# Patient Record
Sex: Male | Born: 1935 | Hispanic: No | Marital: Single | Smoking: Never smoker
Health system: Southern US, Community
[De-identification: ages and names within clinical notes are randomized; demographics above are authoritative.]

## PROBLEM LIST (undated history)

## (undated) DIAGNOSIS — R531 Weakness: Secondary | ICD-10-CM

## (undated) DIAGNOSIS — R0600 Dyspnea, unspecified: Secondary | ICD-10-CM

## (undated) DIAGNOSIS — R011 Cardiac murmur, unspecified: Secondary | ICD-10-CM

## (undated) DIAGNOSIS — Q248 Other specified congenital malformations of heart: Secondary | ICD-10-CM

## (undated) DIAGNOSIS — I10 Essential (primary) hypertension: Secondary | ICD-10-CM

## (undated) DIAGNOSIS — E871 Hypo-osmolality and hyponatremia: Secondary | ICD-10-CM

## (undated) DIAGNOSIS — E039 Hypothyroidism, unspecified: Secondary | ICD-10-CM

## (undated) DIAGNOSIS — I4510 Unspecified right bundle-branch block: Secondary | ICD-10-CM

## (undated) DIAGNOSIS — Z9289 Personal history of other medical treatment: Secondary | ICD-10-CM

## (undated) HISTORY — DX: Cardiac murmur, unspecified: R01.1

## (undated) HISTORY — DX: Hypo-osmolality and hyponatremia: E87.1

## (undated) HISTORY — DX: Other specified congenital malformations of heart: Q24.8

## (undated) HISTORY — DX: Weakness: R53.1

## (undated) HISTORY — DX: Essential (primary) hypertension: I10

## (undated) HISTORY — DX: Dyspnea, unspecified: R06.00

## (undated) HISTORY — DX: Hypothyroidism, unspecified: E03.9

## (undated) HISTORY — DX: Unspecified right bundle-branch block: I45.10

## (undated) HISTORY — DX: Personal history of other medical treatment: Z92.89

---

## 2010-04-22 HISTORY — PX: TRANSTHORACIC ECHOCARDIOGRAM: SHX275

## 2010-04-24 ENCOUNTER — Ambulatory Visit (HOSPITAL_COMMUNITY): Admission: RE | Admit: 2010-04-24 | Payer: Self-pay | Source: Home / Self Care | Admitting: Nephrology

## 2010-04-26 ENCOUNTER — Ambulatory Visit (HOSPITAL_COMMUNITY)
Admission: RE | Admit: 2010-04-26 | Discharge: 2010-04-26 | Payer: Self-pay | Source: Home / Self Care | Attending: Nephrology | Admitting: Nephrology

## 2010-04-26 ENCOUNTER — Ambulatory Visit: Admission: RE | Admit: 2010-04-26 | Discharge: 2010-04-26 | Payer: Self-pay | Source: Home / Self Care

## 2010-04-26 ENCOUNTER — Other Ambulatory Visit: Payer: Self-pay | Admitting: Nephrology

## 2010-04-26 ENCOUNTER — Encounter: Payer: Self-pay | Admitting: Cardiovascular Disease

## 2010-04-27 DIAGNOSIS — Z9289 Personal history of other medical treatment: Secondary | ICD-10-CM

## 2010-04-27 HISTORY — DX: Personal history of other medical treatment: Z92.89

## 2010-05-03 ENCOUNTER — Ambulatory Visit: Payer: Self-pay | Admitting: Cardiology

## 2010-05-22 ENCOUNTER — Ambulatory Visit: Payer: Self-pay | Admitting: Cardiology

## 2010-06-13 ENCOUNTER — Ambulatory Visit (INDEPENDENT_AMBULATORY_CARE_PROVIDER_SITE_OTHER): Payer: Self-pay | Admitting: Cardiology

## 2010-06-13 DIAGNOSIS — I421 Obstructive hypertrophic cardiomyopathy: Secondary | ICD-10-CM

## 2010-09-13 ENCOUNTER — Encounter: Payer: Self-pay | Admitting: *Deleted

## 2010-09-13 ENCOUNTER — Ambulatory Visit: Payer: Self-pay | Admitting: *Deleted

## 2010-09-14 ENCOUNTER — Ambulatory Visit (INDEPENDENT_AMBULATORY_CARE_PROVIDER_SITE_OTHER): Payer: Medicare Other | Admitting: Cardiology

## 2010-09-14 ENCOUNTER — Encounter: Payer: Self-pay | Admitting: Cardiology

## 2010-09-14 DIAGNOSIS — I421 Obstructive hypertrophic cardiomyopathy: Secondary | ICD-10-CM | POA: Insufficient documentation

## 2010-09-14 DIAGNOSIS — I119 Hypertensive heart disease without heart failure: Secondary | ICD-10-CM

## 2010-09-14 DIAGNOSIS — I4510 Unspecified right bundle-branch block: Secondary | ICD-10-CM

## 2010-09-14 DIAGNOSIS — E039 Hypothyroidism, unspecified: Secondary | ICD-10-CM | POA: Insufficient documentation

## 2010-09-14 DIAGNOSIS — I428 Other cardiomyopathies: Secondary | ICD-10-CM

## 2010-09-14 NOTE — Progress Notes (Signed)
Casey Andrade Date of Birth:  Mar 27, 1920 [facility] / [facility] [address]   [address] [place], [place]  [address] [phone]           Fax   [phone]  History of Present Illness: This pleasant 77 year old gentleman is seen for a scheduled followup office visit.  He has a history of hypertrophic obstructive cardiomyopathy.  He has had a good response to beta blockade.  His last echocardiogram was on 04/26/10 and showed a systolic peak gradient of 73 the left ventricular outflow tract.  He had mild LVH and vigorous systolic function in the range of 65-70% and he had evidence of systolic anterior motion of the mitral chordae with moderate to severe mitral regurgitation.  Since starting on the beta blocker his heart murmur has decreased in intensity.  He has not been expressing any chest pain or shortness of breath.He presently is taking 37.5 mg Toprol each morning.  Current Outpatient Prescriptions  Medication Sig Dispense Refill  . Biotin (BIOTIN 5000) 5 MG CAPS Take 1 capsule by mouth daily.        . Cholecalciferol (VITAMIN D3) 2000 UNITS capsule Take 2,000 Units by mouth. occasionally       . diclofenac sodium (VOLTAREN) 1 % GEL Apply topically.        Marland Kitchen doxycycline (DORYX) 100 MG DR capsule Take 100 mg by mouth 2 (two) times daily. Taking as needed      . hydrocortisone 1 % lotion Apply 1 application topically daily. As directed       . levothyroxine (SYNTHROID, LEVOTHROID) 125 MCG tablet Take 125 mcg by mouth daily.        Marland Kitchen losartan (COZAAR) 100 MG tablet Take 100 mg by mouth daily.        . metoprolol succinate (TOPROL-XL) 25 MG 24 hr tablet Take 25 mg by mouth daily. Taking 1 and 1/2 daily      . vitamin B-12 (CYANOCOBALAMIN) 250 MCG tablet Take 250 mcg by mouth daily.        Marland Kitchen DISCONTD: tacrolimus (PROTOPIC) 0.1 % ointment Apply topically daily. As directed (cream)         No Known Allergies  Patient Active Problem List  Diagnoses  . HOCM (hypertrophic  obstructive cardiomyopathy)  . RBBB (right bundle branch block)  . Hypothyroidism  . Benign hypertensive heart disease without heart failure    History  Smoking status  . Never Smoker   Smokeless tobacco  . Not on file    History  Alcohol Use No    Family History  Problem Relation Age of Onset  . Diabetes Sister     Review of Systems: Constitutional: no fever chills diaphoresis or fatigue or change in weight.  Head and neck: no hearing loss, no epistaxis, no photophobia or visual disturbance. Respiratory: No cough, shortness of breath or wheezing. Cardiovascular: No chest pain peripheral edema, palpitations. Gastrointestinal: No abdominal distention, no abdominal pain, no change in bowel habits hematochezia or melena. Genitourinary: No dysuria, no frequency, no urgency, no nocturia. Musculoskeletal:No arthralgias, no back pain, no gait disturbance or myalgias.He does have a lot of problem with mobility of the shoulders. Neurological: No dizziness, no headaches, no numbness, no seizures, no syncope, no weakness, no tremors. Hematologic: No lymphadenopathy, no easy bruising. Psychiatric: No confusion, no hallucinations, no sleep disturbance.    Physical Exam: Filed Vitals:   09/14/10 1106  BP: 120/60  Pulse: 48  The general appearance reveals a frail elderly  gentleman in no acute distress.  He complains of being cold.Pupils equal and reactive.   Extraocular Movements are full.  There is no scleral icterus.  The mouth and pharynx are normal.  The neck is supple.  The carotids reveal no bruits.  The jugular venous pressure is normal.  The thyroid is not enlarged.  There is no lymphadenopathy.The chest is clear to percussion and auscultation. There are no rales or rhonchi. Expansion of the chest is symmetrical.The precordium is quiet.  The first heart sound is normal.  The second heart sound is physiologically split.  There is no gallop rub or click.There is a grade 2/6 systolic  ejection murmur at the apex and base.  There is no abnormal lift or heave.The abdomen is soft and nontender. Bowel sounds are normal. The liver and spleen are not enlarged. There Are no abdominal masses. There are no bruits.Normal extremity without phlebitis or edema.  His EKG shows sinus bradycardia at 48 and he has a pattern of a right bundle branch block.  He does not have any significant changes of left ventricular hypertrophy or strain.   Assessment / Plan:  Because of his resting bradycardia we will not increase his beta blocker any further at this point.  He feels well and is not having any dizziness or syncope or other cardiac side effects.  Recheck in 6 months for followup office visit and EKG

## 2010-09-14 NOTE — Assessment & Plan Note (Signed)
The patient has a history of hypertrophic obstructive cardiomyopathy.  This was picked up on the basis of a murmur which led to an echocardiogram done on 12/26/10.  He has dynamic obstruction through the left ventricular outflow tract by echo 70 mmHg.  His ejection fraction was in the range of 65-70% and he had evidence of grade 1 diastolic dysfunction.  His mitral valve exhibited systolic anterior motion of the anterior mitral chordae and there was a moderate to severe mitral regurgitation.  At that point the patient was placed on beta blocker.  Because of his resting relatively slow heart rate we have had to go up slowly on his beta blocker.  He has tolerated the beta blocker and has not been experiencing any chest pain or dyspnea.  He's not had Any dizzy spells or syncope.

## 2011-03-27 ENCOUNTER — Ambulatory Visit (INDEPENDENT_AMBULATORY_CARE_PROVIDER_SITE_OTHER): Payer: Medicare Other | Admitting: Cardiology

## 2011-03-27 ENCOUNTER — Encounter: Payer: Self-pay | Admitting: Cardiology

## 2011-03-27 VITALS — BP 126/78 | HR 54 | Ht 64.0 in | Wt 142.0 lb

## 2011-03-27 DIAGNOSIS — I119 Hypertensive heart disease without heart failure: Secondary | ICD-10-CM

## 2011-03-27 DIAGNOSIS — I421 Obstructive hypertrophic cardiomyopathy: Secondary | ICD-10-CM

## 2011-03-27 DIAGNOSIS — I4510 Unspecified right bundle-branch block: Secondary | ICD-10-CM

## 2011-03-27 NOTE — Patient Instructions (Signed)
Your physician recommends that you continue on your current medications as directed. Please refer to the Current Medication list given to you today.  Your physician wants you to follow-up in: 6 months. You will receive a reminder letter in the mail two months in advance. If you don't receive a letter, please call our office to schedule the follow-up appointment.  

## 2011-03-27 NOTE — Assessment & Plan Note (Signed)
The patient is tolerating the beta blocker well.  He denies any chest pain or shortness of breath or palpitations.  No dizziness or syncope.

## 2011-03-27 NOTE — Assessment & Plan Note (Signed)
EKG today shows no change in his right bundle branch block pattern.  He is having no symptoms referable to his bundle branch block.

## 2011-03-27 NOTE — Assessment & Plan Note (Signed)
Blood pressure is remaining in normal range on his current therapy of beta blocker and ARB

## 2011-03-27 NOTE — Progress Notes (Signed)
Casey Andrade Date of Birth:  11/07/1919 [facility] / [facility] [address]   [address] [place], [place]  [address] [phone]           Fax   [phone]  HPI: This pleasant 77 year old gentleman is seen for a six-month followup office visit.  He has a history of hypertrophic obstructive cardiomyopathy.  He has had a good response to beta blockade.  His last echocardiogram in January 2012 showed a peak systolic gradient of 73 in the left ventricular outflow tract.  His ejection fraction is 65-70%.  Since starting on his beta blocker his murmur has decreased in intensity.  He presently is taking 25 mg of Toprol in the morning and 12.5  in the evening.  Current Outpatient Prescriptions  Medication Sig Dispense Refill  . Biotin (BIOTIN 5000) 5 MG CAPS Take 1 capsule by mouth daily. Taking 2500 daily      . Cholecalciferol (VITAMIN D3) 2000 UNITS capsule Take 2,000 Units by mouth. occasionally       . diclofenac sodium (VOLTAREN) 1 % GEL Apply topically.        . furosemide (LASIX) 20 MG tablet Take 20 mg by mouth daily. Taking 1/2 daily       . hydrocortisone 1 % lotion Apply 1 application topically daily. As directed       . levothyroxine (SYNTHROID, LEVOTHROID) 125 MCG tablet Take 125 mcg by mouth daily.        Marland Kitchen losartan (COZAAR) 100 MG tablet Take 100 mg by mouth daily.        . metoprolol succinate (TOPROL-XL) 25 MG 24 hr tablet Take 25 mg by mouth daily. Taking 1 and 1/2 daily      . vitamin B-12 (CYANOCOBALAMIN) 250 MCG tablet Take 250 mcg by mouth daily.          No Known Allergies  Patient Active Problem List  Diagnoses  . HOCM (hypertrophic obstructive cardiomyopathy)  . RBBB (right bundle branch block)  . Hypothyroidism  . Benign hypertensive heart disease without heart failure    History  Smoking status  . Never Smoker   Smokeless tobacco  . Not on file    History  Alcohol Use No    Family History  Problem Relation Age of Onset  .  Diabetes Sister     Review of Systems: The patient denies any heat or cold intolerance.  No weight gain or weight loss.  The patient denies headaches or blurry vision.  There is no cough or sputum production.  The patient denies dizziness.  There is no hematuria or hematochezia.  The patient denies any muscle aches or arthritis.  The patient denies any rash.  The patient denies frequent falling or instability.  There is no history of depression or anxiety.  All other systems were reviewed and are negative.   Physical Exam: Filed Vitals:   03/27/11 1134  BP: 126/78  Pulse: 54   the general appearance reveals an elderly frail gentleman in no distress.Pupils equal and reactive.   Extraocular Movements are full.  There is no scleral icterus.  The mouth and pharynx are normal.  The neck is supple.  The carotids reveal no bruits.  The jugular venous pressure is normal.  The thyroid is not enlarged.  There is no lymphadenopathy.  The chest is clear to percussion and auscultation. There are no rales or rhonchi. Expansion of the chest is symmetrical.  The precordium is quiet.  The first  heart sound is normal.  The second heart sound is physiologically split.  There is no gallop rub or click.  There is a grade 2/6 systolic ejection murmur at the base.  No diastolic murmur.  There is no abnormal lift or heave.  The abdomen is soft and nontender. Bowel sounds are normal. The liver and spleen are not enlarged. There Are no abdominal masses. There are no bruits.  The pedal pulses are good.  There is no phlebitis or edema.  There is no cyanosis or clubbing. Strength is normal and symmetrical in all extremities.  There is no lateralizing weakness.  There are no sensory deficits.  EKG shows sinus bradycardia at 54 and right bundle branch block      Assessment / Plan: Continue same medication.  Recheck in 6 months for followup office visit

## 2011-06-03 ENCOUNTER — Other Ambulatory Visit: Payer: Self-pay | Admitting: Cardiology

## 2011-06-03 MED ORDER — LOSARTAN POTASSIUM 100 MG PO TABS: 100.0000 mg | ORAL_TABLET | Freq: Every day | ORAL | Status: DC

## 2011-06-03 NOTE — Telephone Encounter (Signed)
Refilled losartan and adv. Mrs. Casey Andrade

## 2011-09-26 ENCOUNTER — Encounter: Payer: Self-pay | Admitting: Cardiology

## 2011-09-26 ENCOUNTER — Ambulatory Visit (INDEPENDENT_AMBULATORY_CARE_PROVIDER_SITE_OTHER): Payer: Medicare Other | Admitting: Cardiology

## 2011-09-26 VITALS — BP 128/70 | HR 50 | Ht 66.0 in | Wt 148.0 lb

## 2011-09-26 DIAGNOSIS — I119 Hypertensive heart disease without heart failure: Secondary | ICD-10-CM

## 2011-09-26 DIAGNOSIS — I421 Obstructive hypertrophic cardiomyopathy: Secondary | ICD-10-CM

## 2011-09-26 DIAGNOSIS — E039 Hypothyroidism, unspecified: Secondary | ICD-10-CM

## 2011-09-26 MED ORDER — METOPROLOL TARTRATE 25 MG PO TABS: ORAL_TABLET | ORAL | Status: DC

## 2011-09-26 NOTE — Assessment & Plan Note (Signed)
The patient denies any headaches or symptoms of congestive heart failure.  He does have chronic 1+ edema of his ankles.  His nephrologist is not concerned about that and neither am I.  We do want to avoid dehydration in this gentleman who has hypertrophic obstructive cardiomyopathy.  I would rather have him a little bit wet then to get too dry

## 2011-09-26 NOTE — Assessment & Plan Note (Signed)
The patient is clinically euthyroid on current dose of Synthroid. 

## 2011-09-26 NOTE — Progress Notes (Signed)
Casey Andrade Date of Birth:  [birth year] [facility] [address] [place], [place]  [address] [phone]  Fax   [phone]  HPI: This pleasant 78 year old gentleman is seen for a six-month followup office visit.  He has a history of idiopathic hypertrophic obstructive cardiomyopathy.  He has been doing well on low-dose beta-blockade.  He has not been experiencing any dizziness or syncope.  He denies any chest pain.  He's not been aware of any palpitations.  He is on metoprolol tartrate 25 mg in the morning and 12 point 5 in the evening.  Patient also has a history of essential hypertension and a history of a right bundle branch block.  He has a history of hyponatremia and is followed by nephrology who has him limiting his intake of fluids.  Current Outpatient Prescriptions  Medication Sig Dispense Refill  . Biotin (BIOTIN 5000) 5 MG CAPS Take 1 capsule by mouth daily. Taking 2500 daily      . Cholecalciferol (VITAMIN D3) 2000 UNITS capsule Take 2,000 Units by mouth. occasionally       . diclofenac sodium (VOLTAREN) 1 % GEL Apply topically.        . hydrocortisone 1 % lotion Apply 1 application topically daily. As directed       . levothyroxine (SYNTHROID, LEVOTHROID) 100 MCG tablet Take 100 mcg by mouth daily.      Marland Kitchen losartan (COZAAR) 100 MG tablet Take 1 tablet (100 mg total) by mouth daily.  90 tablet  3  . vitamin B-12 (CYANOCOBALAMIN) 250 MCG tablet Take 250 mcg by mouth daily.        . metoprolol tartrate (LOPRESSOR) 25 MG tablet 1 tablet in the morning and 1/2 in the evening  180 tablet  3    No Known Allergies  Patient Active Problem List  Diagnoses  . HOCM (hypertrophic obstructive cardiomyopathy)  . RBBB (right bundle branch block)  . Hypothyroidism  . Benign hypertensive heart disease without heart failure    History  Smoking status  . Never Smoker   Smokeless tobacco  . Not on file    History  Alcohol Use No    Family History    Problem Relation Age of Onset  . Diabetes Sister     Review of Systems: The patient denies any heat or cold intolerance.  No weight gain or weight loss.  The patient denies headaches or blurry vision.  There is no cough or sputum production.  The patient denies dizziness.  There is no hematuria or hematochezia.  The patient denies any muscle aches or arthritis.  The patient denies any rash.  The patient denies frequent falling or instability.  There is no history of depression or anxiety.  All other systems were reviewed and are negative.   Physical Exam: Filed Vitals:   09/26/11 1105  BP: 128/70  Pulse: 50   the general appearance reveals a elderly gentleman in no acute distress.  He does have some recent abrasions on his right hand and right arm from a recent fall.  He denies any associated dizziness or syncope and he apparently merely lost his balance.  Pupils equal and reactive.   Extraocular Movements are full.  There is no scleral icterus.  The mouth and pharynx are normal.  The neck is supple.  The carotids reveal no bruits.  The jugular venous pressure is normal.  The thyroid is not enlarged.  There is no lymphadenopathy.  The chest is clear to percussion  and auscultation. There are no rales or rhonchi. Expansion of the chest is symmetrical.  Heart reveals a grade 3/6 harsh systolic ejection murmur at the base. The abdomen is soft and nontender. Bowel sounds are normal. The liver and spleen are not enlarged. There Are no abdominal masses. There are no bruits.  The pedal pulses are good.  There is no phlebitis .  There is no cyanosis or clubbing. Strength is normal and symmetrical in all extremities.  There is no lateralizing weakness.  There are no sensory deficits.The skin is warm and dry.  There is no rash.       Assessment / Plan: The patient appears to be stable in terms of his cardiac situation.  He will continue same medication and be rechecked in 6 months for office  visit and EKG

## 2011-09-26 NOTE — Patient Instructions (Signed)
Your physician recommends that you continue on your current medications as directed. Please refer to the Current Medication list given to you today.  Your physician wants you to follow-up in: 6 months. You will receive a reminder letter in the mail two months in advance. If you don't receive a letter, please call our office to schedule the follow-up appointment.  

## 2011-09-26 NOTE — Assessment & Plan Note (Signed)
No new symptoms referable to his hypertrophic obstructive cardiomyopathy

## 2012-03-10 ENCOUNTER — Ambulatory Visit (INDEPENDENT_AMBULATORY_CARE_PROVIDER_SITE_OTHER): Payer: Medicare Other | Admitting: Cardiology

## 2012-03-10 ENCOUNTER — Encounter: Payer: Self-pay | Admitting: Cardiology

## 2012-03-10 VITALS — BP 140/70 | HR 50 | Wt 155.0 lb

## 2012-03-10 DIAGNOSIS — I4510 Unspecified right bundle-branch block: Secondary | ICD-10-CM

## 2012-03-10 DIAGNOSIS — I421 Obstructive hypertrophic cardiomyopathy: Secondary | ICD-10-CM

## 2012-03-10 DIAGNOSIS — I119 Hypertensive heart disease without heart failure: Secondary | ICD-10-CM

## 2012-03-10 NOTE — Patient Instructions (Addendum)
Your physician recommends that you continue on your current medications as directed. Please refer to the Current Medication list given to you today.  Your physician wants you to follow-up in: 6 mopnths You will receive a reminder letter in the mail two months in advance. If you don't receive a letter, please call our office to schedule the follow-up appointment.

## 2012-03-10 NOTE — Assessment & Plan Note (Signed)
The patient has not been having any exertional chest pain or dyspnea.  No dizziness or syncope. 

## 2012-03-10 NOTE — Assessment & Plan Note (Signed)
Blood pressure has been stable on current therapy. 

## 2012-03-10 NOTE — Progress Notes (Signed)
[name] Date of Birth:  Nov 13, 1919 [facility] [address] [place], [place]  [address] [phone]  Fax   [phone]  HPI: This pleasant 78 year old gentleman is seen for a six-month followup office visit. He has a history of idiopathic hypertrophic obstructive cardiomyopathy. He has been doing well on low-dose beta-blockade. He has not been experiencing any dizziness or syncope. He denies any chest pain. He's not been aware of any palpitations. He is on metoprolol tartrate 25 mg in the morning and 12.5 mg in the evening. Patient also has a history of essential hypertension and a history of a right bundle branch block. He has a history of hyponatremia and is followed by nephrology who has him limiting his intake of fluids.    Current Outpatient Prescriptions  Medication Sig Dispense Refill  . Biotin (BIOTIN 5000) 5 MG CAPS Take 1 capsule by mouth daily. Taking 2500 daily      . Cholecalciferol (VITAMIN D3) 2000 UNITS capsule Take 2,000 Units by mouth. occasionally       . diclofenac sodium (VOLTAREN) 1 % GEL Apply topically.        . furosemide (LASIX) 20 MG tablet Take 20 mg by mouth as directed. 1/2 tablet daily      . hydrocortisone 1 % lotion Apply 1 application topically daily. As directed       . levothyroxine (SYNTHROID, LEVOTHROID) 100 MCG tablet Take 100 mcg by mouth daily.      Marland Kitchen losartan (COZAAR) 100 MG tablet Take 1 tablet (100 mg total) by mouth daily.  90 tablet  3  . metoprolol tartrate (LOPRESSOR) 25 MG tablet 1 tablet in the morning and 1/2 in the evening  180 tablet  3  . vitamin B-12 (CYANOCOBALAMIN) 250 MCG tablet Take 250 mcg by mouth daily.          No Known Allergies  Patient Active Problem List  Diagnosis  . HOCM (hypertrophic obstructive cardiomyopathy)  . RBBB (right bundle branch block)  . Hypothyroidism  . Benign hypertensive heart disease without heart failure    History  Smoking status  . Never Smoker   Smokeless  tobacco  . Not on file    History  Alcohol Use No    Family History  Problem Relation Age of Onset  . Diabetes Sister     Review of Systems: The patient denies any heat or cold intolerance.  No weight gain or weight loss.  The patient denies headaches or blurry vision.  There is no cough or sputum production.  The patient denies dizziness.  There is no hematuria or hematochezia.  The patient denies any muscle aches or arthritis.  The patient denies any rash.  The patient denies frequent falling or instability.  There is no history of depression or anxiety.  All other systems were reviewed and are negative.   Physical Exam: Filed Vitals:   03/10/12 1156  BP: 140/70  Pulse: 50   the general appearance reveals a well-developed well-nourished elderly gentleman in no distress.The head and neck exam reveals pupils equal and reactive.  Extraocular movements are full.  There is no scleral icterus.  The mouth and pharynx are normal.  The neck is supple.  The carotids reveal no bruits.  The jugular venous pressure is normal.  The  thyroid is not enlarged.  There is no lymphadenopathy.  The chest is clear to percussion and auscultation.  There are no rales or rhonchi.  Expansion of the chest is  symmetrical.  The precordium is quiet.  The first heart sound is normal.  The second heart sound is physiologically split.  There is a grade 3/6 harsh systolic ejection murmur at the base  There is no abnormal lift or heave.  The abdomen is soft and nontender.  The bowel sounds are normal.  The liver and spleen are not enlarged.  There are no abdominal masses.  There are no abdominal bruits.  Extremities reveal good pedal pulses.  There is no phlebitis or edema.  There is no cyanosis or clubbing.  Strength is normal and symmetrical in all extremities.  There is no lateralizing weakness.  There are no sensory deficits.  The skin is warm and dry.  There is no rash.   EKG shows sinus bradycardia at 50 per minute.   The pattern of right bundle branch block is unchanged.   Assessment / Plan: Continue same medication.  Recheck in 6 months for followup office visit.  If stable at that visit we can probably extend his visit up to a year

## 2012-03-10 NOTE — Assessment & Plan Note (Signed)
EKG today shows normal sinus rhythm and right bundle branch block unchanged

## 2012-05-21 ENCOUNTER — Other Ambulatory Visit: Payer: Self-pay

## 2012-05-21 MED ORDER — LOSARTAN POTASSIUM 100 MG PO TABS: 100.0000 mg | ORAL_TABLET | Freq: Every day | ORAL | Status: DC

## 2012-08-25 ENCOUNTER — Other Ambulatory Visit: Payer: Self-pay | Admitting: *Deleted

## 2012-08-25 MED ORDER — LOSARTAN POTASSIUM 100 MG PO TABS: 100.0000 mg | ORAL_TABLET | Freq: Every day | ORAL | Status: DC

## 2012-09-28 ENCOUNTER — Encounter: Payer: Self-pay | Admitting: Cardiology

## 2012-09-28 ENCOUNTER — Ambulatory Visit (INDEPENDENT_AMBULATORY_CARE_PROVIDER_SITE_OTHER): Payer: Medicare Other | Admitting: Cardiology

## 2012-09-28 VITALS — BP 134/68 | HR 54 | Wt 157.8 lb

## 2012-09-28 DIAGNOSIS — I421 Obstructive hypertrophic cardiomyopathy: Secondary | ICD-10-CM

## 2012-09-28 NOTE — Progress Notes (Signed)
Casey Andrade Date of Birth:  1919-11-11 [facility] [address] [place], [place]  [address] [phone]  Fax   [phone]  HPI: This pleasant 79 year old gentleman is seen for a six-month followup office visit. He has a history of idiopathic hypertrophic obstructive cardiomyopathy. He has been doing well on low-dose beta-blockade. He has not been experiencing any dizziness or syncope. He denies any chest pain. He's not been aware of any palpitations. He is on metoprolol tartrate 25 mg in the morning and 12.5 mg in the evening. Patient also has a history of essential hypertension and a history of a right bundle branch block. He has a history of hyponatremia and is followed by nephrology who has him limiting his intake of fluids.  Since last visit he has had no new intercurrent cardiac symptoms.  His appetite is good and his weight is up 2 pounds.   Current Outpatient Prescriptions  Medication Sig Dispense Refill  . Biotin 2500 MCG CAPS Take 2,500 mcg by mouth daily.      . Cholecalciferol (VITAMIN D3) 2000 UNITS capsule Take 2,000 Units by mouth. occasionally       . furosemide (LASIX) 20 MG tablet Take 20 mg by mouth as directed. 1/2 tablet daily      . hydrocortisone 1 % lotion Apply 1 application topically daily. As directed       . levothyroxine (SYNTHROID, LEVOTHROID) 88 MCG tablet Take 88 mcg by mouth daily before breakfast.      . losartan (COZAAR) 100 MG tablet Take 1 tablet (100 mg total) by mouth daily.  90 tablet  3  . metoprolol tartrate (LOPRESSOR) 25 MG tablet 1 tablet in the morning and 1/2 in the evening  180 tablet  3  . vitamin B-12 (CYANOCOBALAMIN) 250 MCG tablet Take 250 mcg by mouth daily.        . diclofenac sodium (VOLTAREN) 1 % GEL Apply topically.         No current facility-administered medications for this visit.    Allergies  Allergen Reactions  . Augmentin (Amoxicillin-Pot Clavulanate)   . Hydrocodone Bitartrate (Hydrocodone)      Patient Active Problem List   Diagnosis Date Noted  . HOCM (hypertrophic obstructive cardiomyopathy) 09/14/2010  . RBBB (right bundle branch block) 09/14/2010  . Hypothyroidism 09/14/2010  . Benign hypertensive heart disease without heart failure 09/14/2010    History  Smoking status  . Never Smoker   Smokeless tobacco  . Not on file    History  Alcohol Use No    Family History  Problem Relation Age of Onset  . Diabetes Sister     Review of Systems: The patient denies any heat or cold intolerance.  No weight gain or weight loss.  The patient denies headaches or blurry vision.  There is no cough or sputum production.  The patient denies dizziness.  There is no hematuria or hematochezia.  The patient denies any muscle aches or arthritis.  The patient denies any rash.  The patient denies frequent falling or instability.  There is no history of depression or anxiety.  All other systems were reviewed and are negative.   Physical Exam: Filed Vitals:   09/28/12 1148  BP: 134/68  Pulse: 54   the general appearance reveals an elderly gentleman in no distress.The head and neck exam reveals pupils equal and reactive.  Extraocular movements are full.  There is no scleral icterus.  The mouth and pharynx are normal.  The neck  is supple.  The carotids reveal no bruits.  The jugular venous pressure is normal.  The  thyroid is not enlarged.  There is no lymphadenopathy.  The chest is clear to percussion and auscultation.  There are no rales or rhonchi.  Expansion of the chest is symmetrical.  The precordium is quiet.  The first heart sound is normal.  The second heart sound is physiologically split.  There is no  gallop rub or click.  There is a grade 2/6 harsh systolic ejection murmur at the base consistent with IHSS.  There is no abnormal lift or heave.  The abdomen is soft and nontender.  The bowel sounds are normal.  The liver and spleen are not enlarged.  There are no abdominal masses.   There are no abdominal bruits.  Extremities reveal good pedal pulses.  There is no phlebitis or edema.  There is no cyanosis or clubbing.  Strength is normal and symmetrical in all extremities.  There is no lateralizing weakness.  There are no sensory deficits.  The skin is warm and dry.  There is no rash.      Assessment / Plan: Continue on same medication.  Recheck in 6 months for followup office visit and EKG

## 2012-09-28 NOTE — Assessment & Plan Note (Signed)
The patient has not been experiencing any exertional dizziness or syncope.  No chest pain or angina.  No awareness of palpitations.

## 2012-09-28 NOTE — Patient Instructions (Addendum)
Your physician recommends that you continue on your current medications as directed. Please refer to the Current Medication list given to you today.  Your physician wants you to follow-up in: 6 MONTH OV /EKG You will receive a reminder letter in the mail two months in advance. If you don't receive a letter, please call our office to schedule the follow-up appointment.  

## 2012-12-22 ENCOUNTER — Other Ambulatory Visit: Payer: Self-pay | Admitting: Cardiology

## 2013-03-24 ENCOUNTER — Other Ambulatory Visit: Payer: Medicare Other

## 2013-03-29 ENCOUNTER — Ambulatory Visit: Payer: Medicare PPO | Admitting: Cardiology

## 2013-03-31 ENCOUNTER — Ambulatory Visit: Payer: Medicare Other | Admitting: Endocrinology

## 2013-05-20 ENCOUNTER — Ambulatory Visit: Payer: Medicare PPO | Admitting: Cardiology

## 2013-06-15 ENCOUNTER — Ambulatory Visit: Payer: Medicare PPO | Admitting: Cardiology

## 2013-06-30 ENCOUNTER — Ambulatory Visit: Payer: Medicare PPO | Admitting: Cardiology

## 2013-09-17 ENCOUNTER — Other Ambulatory Visit: Payer: Self-pay

## 2013-09-17 MED ORDER — METOPROLOL TARTRATE 25 MG PO TABS
ORAL_TABLET | ORAL | Status: DC
Start: 2013-09-17 — End: 2013-09-30

## 2013-09-22 ENCOUNTER — Encounter: Payer: Self-pay | Admitting: Cardiology

## 2013-09-30 ENCOUNTER — Encounter (INDEPENDENT_AMBULATORY_CARE_PROVIDER_SITE_OTHER): Payer: Self-pay

## 2013-09-30 ENCOUNTER — Ambulatory Visit (INDEPENDENT_AMBULATORY_CARE_PROVIDER_SITE_OTHER): Payer: Medicare PPO | Admitting: Cardiology

## 2013-09-30 ENCOUNTER — Encounter: Payer: Self-pay | Admitting: Cardiology

## 2013-09-30 VITALS — BP 111/64 | HR 46 | Ht 66.0 in | Wt 157.0 lb

## 2013-09-30 DIAGNOSIS — I498 Other specified cardiac arrhythmias: Secondary | ICD-10-CM

## 2013-09-30 DIAGNOSIS — I421 Obstructive hypertrophic cardiomyopathy: Secondary | ICD-10-CM

## 2013-09-30 DIAGNOSIS — R001 Bradycardia, unspecified: Secondary | ICD-10-CM

## 2013-09-30 DIAGNOSIS — I119 Hypertensive heart disease without heart failure: Secondary | ICD-10-CM

## 2013-09-30 NOTE — Assessment & Plan Note (Signed)
Blood pressure is stable on current therapy.  However his heart rate is 46 and may be contributing to some loss of energy.  We will reduce his metoprolol to just 12.5 mg twice a day.

## 2013-09-30 NOTE — Assessment & Plan Note (Signed)
No chest pain.  No dizziness or syncope.

## 2013-09-30 NOTE — Patient Instructions (Addendum)
DECREASE YOUR LOPRESSOR (METOPROLOL) TO 1/2 TABLET TWICE A DAY  Your physician wants you to follow-up in: 10 month ov/ekg  You will receive a reminder letter in the mail two months in advance. If you don't receive a letter, please call our office to schedule the follow-up appointment.

## 2013-09-30 NOTE — Progress Notes (Signed)
Casey Andrade Date of Birth:  Oct 02, 1919 [facility] [address] [place], [place]  [address] [phone]  Fax   [phone]  HPI: This pleasant 80 year old gentleman is seen for a one-year followup office visit. He has a history of idiopathic hypertrophic obstructive cardiomyopathy. He has been doing well on low-dose beta-blockade. He has not been experiencing any dizziness or syncope. He denies any chest pain. He's not been aware of any palpitations. He is on metoprolol tartrate 25 mg in the morning and 12.5 mg in the evening. Patient also has a history of essential hypertension and a history of a right bundle branch block. He has a history of hyponatremia and is followed by nephrology who has him limiting his intake of fluids. Since last visit he has had no new intercurrent cardiac symptoms. His appetite is good and his weight is unchanged.  He has had some recent pedal edema and his nephrologist his told him to take his furosemide 20 mg each day for 4 consecutive days and then contact nephrology.   Current Outpatient Prescriptions  Medication Sig Dispense Refill  . Biotin 2500 MCG CAPS Take 2,500 mcg by mouth daily.      . Cholecalciferol (VITAMIN D3) 2000 UNITS capsule Take 2,000 Units by mouth. occasionally       . diclofenac sodium (VOLTAREN) 1 % GEL Apply topically.        . furosemide (LASIX) 20 MG tablet Take 20 mg by mouth as directed. 1/2 tablet daily      . hydrocortisone 1 % lotion Apply 1 application topically daily. As directed       . levothyroxine (SYNTHROID, LEVOTHROID) 88 MCG tablet Take 88 mcg by mouth daily before breakfast.      . losartan (COZAAR) 100 MG tablet Take 1 tablet (100 mg total) by mouth daily.  90 tablet  3  . metoprolol tartrate (LOPRESSOR) 25 MG tablet TAKE 1/2 TABLET TWICE A DAY      . vitamin B-12 (CYANOCOBALAMIN) 250 MCG tablet Take 250 mcg by mouth daily.         No current facility-administered medications for this visit.     Allergies  Allergen Reactions  . Augmentin [Amoxicillin-Pot Clavulanate]   . Hydrocodone Bitartrate [Hydrocodone]     Patient Active Problem List   Diagnosis Date Noted  . HOCM (hypertrophic obstructive cardiomyopathy) 09/14/2010  . RBBB (right bundle branch block) 09/14/2010  . Hypothyroidism 09/14/2010  . Benign hypertensive heart disease without heart failure 09/14/2010    History  Smoking status  . Never Smoker   Smokeless tobacco  . Not on file    History  Alcohol Use No    Family History  Problem Relation Age of Onset  . Diabetes Sister     Review of Systems: The patient denies any heat or cold intolerance.  No weight gain or weight loss.  The patient denies headaches or blurry vision.  There is no cough or sputum production.  The patient denies dizziness.  There is no hematuria or hematochezia.  The patient denies any muscle aches or arthritis.  The patient denies any rash.  The patient denies frequent falling or instability.  There is no history of depression or anxiety.  All other systems were reviewed and are negative.   Physical Exam: Filed Vitals:   09/30/13 1136  BP: 111/64  Pulse: 46   general appearance reveals a well-developed elderly gentleman in no distress.The head and neck exam reveals pupils  equal and reactive.  Extraocular movements are full.  There is no scleral icterus.  The mouth and pharynx are normal.  The neck is supple.  The carotids reveal no bruits.  The jugular venous pressure is normal.  The  thyroid is not enlarged.  There is no lymphadenopathy.  The chest is clear to percussion and auscultation.  There are no rales or rhonchi.  Expansion of the chest is symmetrical.  The precordium is quiet.  The first heart sound is normal.  The second heart sound is physiologically split.  There is grade 3/6 harsh systolic ejection murmur at the base.  There is no abnormal lift or heave.  The abdomen is soft and nontender.  The bowel sounds are normal.   The liver and spleen are not enlarged.  There are no abdominal masses.  There are no abdominal bruits.  Extremities reveal good pedal pulses.  There is mild bilateral pedal edema.  There is no cyanosis or clubbing.  Strength is normal and symmetrical in all extremities.  There is no lateralizing weakness.  There are no sensory deficits.  The skin is warm and dry.  There is no rash.   EKG shows marked sinus bradycardia, bifascicular block and LVH with QRS widening.  Assessment / Plan: 1.  Hypertrophic obstructive cardiomyopathy 2. hyponatremia managed by nephrology.  Recent serum sodium 130. 3. mild peripheral edema  Plan: Reduce Lopressor to just 12.5 mg twice a day Recheck in 10 months for followup office visit and EKG.

## 2013-10-01 ENCOUNTER — Other Ambulatory Visit: Payer: Self-pay | Admitting: *Deleted

## 2013-10-01 MED ORDER — METOPROLOL TARTRATE 25 MG PO TABS: 12.5000 mg | ORAL_TABLET | Freq: Two times a day (BID) | ORAL | Status: DC

## 2013-11-21 ENCOUNTER — Other Ambulatory Visit: Payer: Self-pay | Admitting: Cardiology

## 2014-05-18 ENCOUNTER — Other Ambulatory Visit: Payer: Self-pay | Admitting: Cardiology

## 2014-08-09 ENCOUNTER — Ambulatory Visit: Payer: Medicare PPO | Admitting: Cardiology

## 2014-08-17 ENCOUNTER — Other Ambulatory Visit: Payer: Self-pay | Admitting: Cardiology

## 2014-08-18 ENCOUNTER — Other Ambulatory Visit: Payer: Self-pay | Admitting: *Deleted

## 2014-08-18 MED ORDER — LOSARTAN POTASSIUM 100 MG PO TABS: ORAL_TABLET | ORAL | Status: DC

## 2014-09-21 ENCOUNTER — Encounter: Payer: Self-pay | Admitting: Cardiology

## 2014-09-21 ENCOUNTER — Ambulatory Visit (INDEPENDENT_AMBULATORY_CARE_PROVIDER_SITE_OTHER): Payer: Medicare PPO | Admitting: Cardiology

## 2014-09-21 VITALS — BP 134/80 | HR 71 | Ht 66.0 in | Wt 158.4 lb

## 2014-09-21 DIAGNOSIS — I421 Obstructive hypertrophic cardiomyopathy: Secondary | ICD-10-CM | POA: Diagnosis not present

## 2014-09-21 DIAGNOSIS — I4510 Unspecified right bundle-branch block: Secondary | ICD-10-CM | POA: Diagnosis not present

## 2014-09-21 DIAGNOSIS — I119 Hypertensive heart disease without heart failure: Secondary | ICD-10-CM | POA: Diagnosis not present

## 2014-09-21 NOTE — Patient Instructions (Signed)
Medication Instructions:  Your physician recommends that you continue on your current medications as directed. Please refer to the Current Medication list given to you today.  Labwork: none  Testing/Procedures: none  Follow-Up: Your physician wants you to follow-up in: 1 year ov/ekg You will receive a reminder letter in the mail two months in advance. If you don't receive a letter, please call our office to schedule the follow-up appointment.     
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
## 2014-09-26 ENCOUNTER — Other Ambulatory Visit: Payer: Self-pay | Admitting: Cardiology

## 2014-11-09 ENCOUNTER — Other Ambulatory Visit: Payer: Self-pay | Admitting: Cardiology

## 2014-12-29 DIAGNOSIS — I10 Essential (primary) hypertension: Secondary | ICD-10-CM | POA: Diagnosis not present

## 2014-12-29 DIAGNOSIS — E871 Hypo-osmolality and hyponatremia: Secondary | ICD-10-CM | POA: Diagnosis not present

## 2014-12-29 DIAGNOSIS — R609 Edema, unspecified: Secondary | ICD-10-CM | POA: Diagnosis not present

## 2014-12-29 DIAGNOSIS — E559 Vitamin D deficiency, unspecified: Secondary | ICD-10-CM | POA: Diagnosis not present

## 2015-01-24 DIAGNOSIS — H01002 Unspecified blepharitis right lower eyelid: Secondary | ICD-10-CM | POA: Diagnosis not present

## 2015-01-24 DIAGNOSIS — Z961 Presence of intraocular lens: Secondary | ICD-10-CM | POA: Diagnosis not present

## 2015-01-24 DIAGNOSIS — H5202 Hypermetropia, left eye: Secondary | ICD-10-CM | POA: Diagnosis not present

## 2015-01-24 DIAGNOSIS — H52223 Regular astigmatism, bilateral: Secondary | ICD-10-CM | POA: Diagnosis not present

## 2015-01-24 DIAGNOSIS — H524 Presbyopia: Secondary | ICD-10-CM | POA: Diagnosis not present

## 2015-01-24 DIAGNOSIS — H01005 Unspecified blepharitis left lower eyelid: Secondary | ICD-10-CM | POA: Diagnosis not present

## 2015-01-24 DIAGNOSIS — H5211 Myopia, right eye: Secondary | ICD-10-CM | POA: Diagnosis not present

## 2015-06-22 ENCOUNTER — Encounter: Payer: Self-pay | Admitting: Cardiology

## 2015-06-22 ENCOUNTER — Ambulatory Visit (INDEPENDENT_AMBULATORY_CARE_PROVIDER_SITE_OTHER): Payer: Medicare Other | Admitting: Cardiology

## 2015-06-22 VITALS — BP 122/64 | HR 48 | Ht 66.0 in | Wt 161.1 lb

## 2015-06-22 DIAGNOSIS — I119 Hypertensive heart disease without heart failure: Secondary | ICD-10-CM

## 2015-06-22 DIAGNOSIS — I421 Obstructive hypertrophic cardiomyopathy: Secondary | ICD-10-CM

## 2015-06-22 DIAGNOSIS — I4510 Unspecified right bundle-branch block: Secondary | ICD-10-CM | POA: Diagnosis not present

## 2015-06-22 DIAGNOSIS — R001 Bradycardia, unspecified: Secondary | ICD-10-CM
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
## 2015-06-22 NOTE — Patient Instructions (Signed)
Medication Instructions:  Your physician recommends that you continue on your current medications as directed. Please refer to the Current Medication list given to you today.  Labwork: none  Testing/Procedures: none  Follow-Up: Your physician wants you to follow-up in: 1 year ov with Dr Harding at the [facility] office  You will receive a reminder letter in the mail two months in advance. If you don't receive a letter, please call our office to schedule the follow-up appointment.  If you need a refill on your cardiac medications before your next appointment, please call your pharmacy.  

## 2015-09-07 ENCOUNTER — Other Ambulatory Visit: Payer: Self-pay

## 2015-09-07 MED ORDER — METOPROLOL TARTRATE 25 MG PO TABS: 12.5000 mg | ORAL_TABLET | Freq: Two times a day (BID) | ORAL | Status: DC

## 2015-11-03 ENCOUNTER — Other Ambulatory Visit: Payer: Self-pay | Admitting: *Deleted

## 2015-11-03 MED ORDER — LOSARTAN POTASSIUM 100 MG PO TABS: ORAL_TABLET | ORAL | Status: DC

## 2015-11-17 ENCOUNTER — Other Ambulatory Visit: Payer: Self-pay | Admitting: Cardiology

## 2015-11-17 NOTE — Telephone Encounter (Signed)
losartan (COZAAR) 100 MG tablet  Medication  Date: 11/03/2015 Department: [facility] Ordering: Marykay Lex, MD Authorizing: [name], MD  Order Providers   Prescribing Provider Encounter Provider  [name], MD Stann Mainland, RN  Medication Detail    Disp Refills Start End   losartan (COZAAR) 100 MG tablet 90 tablet 3 11/03/2015    Sig: TAKE 1 TABLET (100 MG TOTAL) BY MOUTH DAILY.   E-Prescribing Status: Receipt confirmed by pharmacy (11/03/2015 4:49 PM EDT)   Pharmacy   [id number] - [place], [place] - [address]

## 2016-01-12 ENCOUNTER — Ambulatory Visit: Payer: Medicare Other | Admitting: Cardiology

## 2016-05-06 ENCOUNTER — Other Ambulatory Visit: Payer: Self-pay | Admitting: Cardiology

## 2016-08-15 ENCOUNTER — Ambulatory Visit (INDEPENDENT_AMBULATORY_CARE_PROVIDER_SITE_OTHER): Payer: Medicare Other | Admitting: Cardiology

## 2016-08-15 ENCOUNTER — Encounter: Payer: Self-pay | Admitting: Cardiology

## 2016-08-15 VITALS — BP 154/74 | HR 47 | Ht 66.0 in | Wt 152.4 lb

## 2016-08-15 DIAGNOSIS — I340 Nonrheumatic mitral (valve) insufficiency: Secondary | ICD-10-CM

## 2016-08-15 DIAGNOSIS — I119 Hypertensive heart disease without heart failure: Secondary | ICD-10-CM

## 2016-08-15 DIAGNOSIS — I421 Obstructive hypertrophic cardiomyopathy: Secondary | ICD-10-CM | POA: Diagnosis not present

## 2016-08-15 DIAGNOSIS — I4510 Unspecified right bundle-branch block: Secondary | ICD-10-CM

## 2016-08-15 NOTE — Patient Instructions (Addendum)
NO CHANGE WITH CURRENT TREATMENT -- Low threshold for PCP to stop beta blocker  Your physician recommends that you schedule a follow-up appointment as needed basis. Continue to follow up with PCP.

## 2016-08-15 NOTE — Progress Notes (Signed)
PCP: Mickie Hillier, MD  Clinic Note: Chief Complaint  Patient presents with  . Follow-up    6 months; mitral regurgitation with SAM from LVOT gradient    HPI: Casey Andrade is a 83 y.o. male with a PMH below who presents today for What amounts to be annual follow-up and establishing with a new cardiologist.  He is a former patient of Dr.Brackbill with a history of idiopathic hypertrophic obstructive cardiomyopathy. He has been doing well on low-dose beta-blockade. He has not been experiencing any dizziness or syncope. He denies any chest pain. He's not been aware of any palpitations. He is on metoprolol tartrate 12.5 mg twice a day  Casey Andrade - "Casey Andrade "was last seen on 06/22/2015 by Dr. Patty Sermons. He was doing fairly well on low-dose beta blocker is not having any symptoms.  Recent Hospitalizations: None  Studies Reviewed:    2-D echo January 2012: Vigorous LV function with systolic anterior motion of the anterior mitral valve mild dynamic LVOT obstruction with peak gradient 70 mmHg. EF 65-70%. GR 1 DD. Moderate-severe MR   Interval History: "Casey Andrade "is a very pleasant elderly gentleman who presents today along with his wife and oldest daughter. He really doesn't have any major complaints besides asking 3-4 times during the visit if he has an enlarged heart. Apparently, on the ride to the clinic he was discussing this with his daughter as they were trying to figure out why he was coming to cardiologist.  Casey Maduro really is not overly active and doesn't do that much in the way of any routine exertion. He is able to get around okay, but very slowly. With what he is able to do, he denies any chest tightness or pressure episodes. No PND, orthopnea with edema that comes off and on. He is no longer taking Lasix. He has not had any sensation whatsoever of rapid irregular heartbeats or palpitations. In fact every to ask question he looks at his wife and daughter to see if they can  tell me. Had the symptoms. So he clearly is not a great historian. No syncope/near syncope or TIAs amaurosis fugax symptoms.  No claudication.  ROS: A comprehensive was performed. Review of Systems  Constitutional: Positive for malaise/fatigue (Mostly because he is forced to be sedentary secondary to his arthritis pains. - Just moves a lot slower than he used to).  HENT: Negative for congestion and nosebleeds.   Respiratory: Positive for shortness of breath. Negative for cough.   Cardiovascular: Negative.        Per history of present illness  Gastrointestinal: Positive for heartburn. Negative for abdominal pain, blood in stool and melena.  Genitourinary: Negative for hematuria.  Musculoskeletal: Positive for back pain and neck pain.  Skin: Negative.   Neurological: Positive for dizziness (If he changes position too quickly).  Psychiatric/Behavioral: Positive for memory loss. Negative for hallucinations. The patient is not nervous/anxious.   All other systems reviewed and are negative.  I have reviewed and (if needed) updated the patient's problem list, medications, allergies, past medical and surgical history, social and family history.  Past Medical History:  Diagnosis Date  . Dyspnea    increasing  . Essential hypertension   . History of echocardiogram 04/27/2010   EF was in the range of 65-70% / Mitral valve, systolic anterior motion of the anterior mitral chordae               . Hypertension   . Hypertr obst cardiomyop   .  Hyponatremia    for which nephrology is seeing him  . Hypothyroidism   . 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 Murmur    noted to have loud murmur  . Right bundle branch block   . Weakness    chronic weakness without syncope    Past Surgical History:  Procedure Laterality Date  . TRANSTHORACIC ECHOCARDIOGRAM  04/2010   Vigorous LV function with systolic anterior  motion of the anterior mitral valve mild dynamic LVOT obstruction with peak gradient 70 mmHg. EF 65-70%. GR 1 DD. Moderate-severe MR    Current Meds  Medication Sig  . Biotin 2500 MCG CAPS Take 2,500 mcg by mouth daily.  . Cholecalciferol (VITAMIN D3) 2000 UNITS capsule Take 2,000 Units by mouth. occasionally   . diclofenac sodium (VOLTAREN) 1 % GEL Apply 2 g topically daily as needed (for pain).   . furosemide (LASIX) 20 MG tablet Take 20 mg by mouth daily.   . hydrocortisone 1 % lotion Apply 1 application topically daily. As directed   . losartan (COZAAR) 100 MG tablet TAKE 1 TABLET (100 MG TOTAL) BY MOUTH DAILY.  . metoprolol tartrate (LOPRESSOR) 25 MG tablet Take 0.5 tablets (12.5 mg total) by mouth 2 (two) times daily.  Marland Kitchen SYNTHROID 50 MCG tablet Take 50 mcg by mouth daily before breakfast.  . vitamin B-12 (CYANOCOBALAMIN) 250 MCG tablet Take 250 mcg by mouth daily.      Allergies  Allergen Reactions  . Augmentin [Amoxicillin-Pot Clavulanate]     Doesn't recall  . Hydrocodone Bitartrate [Hydrocodone]     Doesn't recall    Social History   Social History  . Marital status: Single    Spouse name: N/A  . Number of children: N/A  . Years of education: N/A   Social History Main Topics  . Smoking status: Never Smoker  . Smokeless tobacco: Never Used  . Alcohol use No  . Drug use: No  . Sexual activity: Not Asked   Other Topics Concern  . None   Social History Narrative   Patient is married, he has a son-in-law who is a Development worker, international aid in [place]    family history includes Diabetes in his sister.  Wt Readings from Last 3 Encounters:  08/15/16 152 lb 6.4 oz (69.1 kg)  06/22/15 161 lb 1.9 oz (73.1 kg)  09/21/14 158 lb 6.4 oz (71.8 kg)    PHYSICAL EXAM BP (!) 154/74   Pulse (!) 47   Ht  (1.676 m)   Wt 152 lb 6.4 oz (69.1 kg)   BMI 24.60 kg/m  General appearance: alert, cooperative, appears stated age, no distress.  He actually appears relatively  well-nourished and well-groomed for his age.  Neck: no adenopathy, no carotid bruit and no JVD; He has significant cervical and upper thoracic kyphoscoliosis. Lungs: clear to auscultation bilaterally, normal percussion bilaterally and non-labored Heart: RRR with normal S1 and S2. 2/6 mid peaking harsh SEM at lower border radiating to carotids. He also has 203/6 HSM at apex. Otherwise no rubs or gallops. Abdomen: soft, non-tender; bowel sounds normal; no masses,  no organomegaly; no HJR Extremities: extremities normal, atraumatic, no cyanosis, and edema - trivial Pulses: 2+ and symmetric;  Skin: no evidence of bleeding or bruising, no lesions noted, temperature normal and texture normal; no rashes Neurologic: Mental status: Alert & oriented x 3, thought content appropriate; poor historian with some memory issues. Pleasant mood  and affect; Cranial nerves: normal (II-XII grossly intact)    Adult ECG Report  Rate: 47 ;  Rhythm: sinus bradycardia; RBBB. Otherwise normal axis, intervals and durations. Stable bradycardia.  Narrative Interpretation: Stable bradycardia.   Other studies Reviewed: Additional studies/ records that were reviewed today include:  Recent Labs:  No results found for: CREATININE, BUN, NA, K, CL, CO2   ASSESSMENT / PLAN: Long discussion with Casey Andrade his wife and his daughter during this visit. The daughter asked (she is a Engineer, civil (consulting) working for her husband who is a cardiologist in [place]) if I felt like her father need to continue to cardiology. He is a very pleasant enjoyable gentleman and I enjoyed spinning, the family, but I do thank visit he is not to have any invasive procedures done. We are not to keep checking stress test or echocardiograms on him. Will simply continue ARB plus or minus his beta blocker which quite frankly could be stopped. Unless he has new concerning issues, I think he is probably safe to follow-up with his PCP provided his PCP will be happy to prescribe  prescriptions for his medications. It is my understanding that they do have advanced planning with advanced directives in place. I thought I heard that he is DO NOT RESUSCITATE, but cannot confirm.  Problem List Items Addressed This Visit    Benign hypertensive heart disease without heart failure - Primary    I actually suspect that his hypertrophic adenopathy is really more related to this tendon is hypertrophic. He doesn't have thickened septal wall described and the wall thickness did not appear consistent with HOCM  Given his age and concern for falls, I am okay with permissive hypertension. He is not overly asymptomatic and therefore an oblique we be aggressive with blood pressure control to minimize his MR. Continue current dose of losartan.      HOCM (hypertrophic obstructive cardiomyopathy) (HCC)    Unusual diagnosis for patient in this age. Probably not true genetic hypertrophic cardiomyopathy. I would suspect is probably more related to hypertensive cardia myopathy. He does have systolic anterior motion of the mitral valve leaflets, but would not be interested in any invasive procedures. He has his doing fairly well and not symptomatic. Although he does have sinus bradycardia which is somewhat concerning, I think we probably need to continue his beta blocker for now, but low threshold to stop if there is any concern for fatigue. He is on losartan for afterload reduction in his blood pressure is mildly elevated. Needs to avoid dehydration.      Relevant Orders   EKG 12-Lead (Completed)   Mitral valvular regurgitation (Chronic)    Echo complete years ago did suggest moderate to severe MR. I did hear MR murmur on exam, but was not blowing significant. He is not having them them that would suggest he has severe MR. He is not having heartburn symptoms or dyspnea. He would not be a candidate for invasive valve replacement repair. As such, I don't think any further evaluation is required.  He would probably be better off just following up with his PCP.      RBBB (right bundle branch block)   Relevant Orders   EKG 12-Lead (Completed)      Current medicines are reviewed at length with the patient today. (+/- concerns) n/a The following changes have been made: n/a  Patient Instructions  NO CHANGE WITH CURRENT TREATMENT -- Low threshold for PCP to stop beta blocker  Your physician recommends that you schedule  a follow-up appointment as needed basis. Continue to follow up with PCP.    Studies Ordered:   Orders Placed This Encounter  Procedures  . EKG 12-Lead      Bryan Lemma, M.D., M.S. Interventional Cardiologist   Pager # [phone] Phone # [phone] [address] [place], [place] [address]

## 2016-08-17 ENCOUNTER — Encounter: Payer: Self-pay | Admitting: Cardiology

## 2016-08-17 DIAGNOSIS — I340 Nonrheumatic mitral (valve) insufficiency: Secondary | ICD-10-CM | POA: Insufficient documentation

## 2016-08-17 NOTE — Assessment & Plan Note (Signed)
I actually suspect that his hypertrophic adenopathy is really more related to this tendon is hypertrophic. He doesn't have thickened septal wall described and the wall thickness did not appear consistent with HOCM  Given his age and concern for falls, I am okay with permissive hypertension. He is not overly asymptomatic and therefore an oblique we be aggressive with blood pressure control to minimize his MR. Continue current dose of losartan.

## 2016-08-17 NOTE — Assessment & Plan Note (Signed)
Unusual diagnosis for patient in this age. Probably not true genetic hypertrophic cardiomyopathy. I would suspect is probably more related to hypertensive cardia myopathy. He does have systolic anterior motion of the mitral valve leaflets, but would not be interested in any invasive procedures. He has his doing fairly well and not symptomatic. Although he does have sinus bradycardia which is somewhat concerning, I think we probably need to continue his beta blocker for now, but low threshold to stop if there is any concern for fatigue. He is on losartan for afterload reduction in his blood pressure is mildly elevated. Needs to avoid dehydration.

## 2016-08-17 NOTE — Assessment & Plan Note (Signed)
Echo complete years ago did suggest moderate to severe MR. I did hear MR murmur on exam, but was not blowing significant. He is not having them them that would suggest he has severe MR. He is not having heartburn symptoms or dyspnea. He would not be a candidate for invasive valve replacement repair. As such, I don't think any further evaluation is required. He would probably be better off just following up with his PCP.

## 2016-09-08 ENCOUNTER — Other Ambulatory Visit: Payer: Self-pay | Admitting: Cardiology

## 2016-09-09 NOTE — Telephone Encounter (Signed)
Rx request sent to pharmacy.  

## 2017-08-23 ENCOUNTER — Other Ambulatory Visit: Payer: Self-pay | Admitting: Cardiology

## 2017-08-25 NOTE — Telephone Encounter (Signed)
REFILL 

## 2018-09-15 ENCOUNTER — Emergency Department (HOSPITAL_COMMUNITY): Payer: Medicare Other

## 2018-09-15 ENCOUNTER — Emergency Department (HOSPITAL_COMMUNITY)
Admission: EM | Admit: 2018-09-15 | Discharge: 2018-09-15 | Disposition: A | Discharge status: Home / Self Care | Payer: Medicare Other | Attending: Emergency Medicine | Admitting: Emergency Medicine

## 2018-09-15 ENCOUNTER — Encounter (HOSPITAL_COMMUNITY): Payer: Self-pay

## 2018-09-15 ENCOUNTER — Other Ambulatory Visit: Payer: Self-pay

## 2018-09-15 DIAGNOSIS — W0110XA Fall on same level from slipping, tripping and stumbling with subsequent striking against unspecified object, initial encounter: Secondary | ICD-10-CM | POA: Diagnosis not present

## 2018-09-15 DIAGNOSIS — Y9389 Activity, other specified: Secondary | ICD-10-CM | POA: Diagnosis not present

## 2018-09-15 DIAGNOSIS — S0003XA Contusion of scalp, initial encounter: Secondary | ICD-10-CM | POA: Diagnosis not present

## 2018-09-15 DIAGNOSIS — E039 Hypothyroidism, unspecified: Secondary | ICD-10-CM | POA: Diagnosis not present

## 2018-09-15 DIAGNOSIS — Y998 Other external cause status: Secondary | ICD-10-CM | POA: Insufficient documentation

## 2018-09-15 DIAGNOSIS — M542 Cervicalgia: Secondary | ICD-10-CM | POA: Insufficient documentation

## 2018-09-15 DIAGNOSIS — W19XXXA Unspecified fall, initial encounter: Secondary | ICD-10-CM

## 2018-09-15 DIAGNOSIS — S0083XA Contusion of other part of head, initial encounter: Secondary | ICD-10-CM

## 2018-09-15 DIAGNOSIS — I10 Essential (primary) hypertension: Secondary | ICD-10-CM | POA: Insufficient documentation

## 2018-09-15 DIAGNOSIS — Y92012 Bathroom of single-family (private) house as the place of occurrence of the external cause: Secondary | ICD-10-CM | POA: Insufficient documentation

## 2018-09-15 DIAGNOSIS — S0990XA Unspecified injury of head, initial encounter: Secondary | ICD-10-CM | POA: Diagnosis present

## 2018-09-15 DIAGNOSIS — Z79899 Other long term (current) drug therapy: Secondary | ICD-10-CM | POA: Insufficient documentation

## 2018-09-15 LAB — CBC WITH DIFFERENTIAL/PLATELET
Abs Immature Granulocytes: 0.05 10*3/uL (ref 0.00–0.07)
Basophils Absolute: 0.1 10*3/uL (ref 0.0–0.1)
Basophils Relative: 1 %
Eosinophils Absolute: 0.1 10*3/uL (ref 0.0–0.5)
Eosinophils Relative: 1 %
HCT: 39 % (ref 39.0–52.0)
Hemoglobin: 12.8 g/dL — ABNORMAL LOW (ref 13.0–17.0)
Immature Granulocytes: 1 %
Lymphocytes Relative: 5 %
Lymphs Abs: 0.6 10*3/uL — ABNORMAL LOW (ref 0.7–4.0)
MCH: 32.2 pg (ref 26.0–34.0)
MCHC: 32.8 g/dL (ref 30.0–36.0)
MCV: 98.2 fL (ref 80.0–100.0)
Monocytes Absolute: 1.2 10*3/uL — ABNORMAL HIGH (ref 0.1–1.0)
Monocytes Relative: 12 %
Neutro Abs: 8.6 10*3/uL — ABNORMAL HIGH (ref 1.7–7.7)
Neutrophils Relative %: 80 %
Platelets: 189 10*3/uL (ref 150–400)
RBC: 3.97 MIL/uL — ABNORMAL LOW (ref 4.22–5.81)
RDW: 12.1 % (ref 11.5–15.5)
WBC: 10.5 10*3/uL (ref 4.0–10.5)
nRBC: 0 % (ref 0.0–0.2)

## 2018-09-15 LAB — URINALYSIS, ROUTINE W REFLEX MICROSCOPIC
Bilirubin Urine: NEGATIVE
Glucose, UA: NEGATIVE mg/dL
Hgb urine dipstick: NEGATIVE
Ketones, ur: NEGATIVE mg/dL
Leukocytes,Ua: NEGATIVE
Nitrite: NEGATIVE
Protein, ur: NEGATIVE mg/dL
Specific Gravity, Urine: 1.011 (ref 1.005–1.030)
pH: 7 (ref 5.0–8.0)

## 2018-09-15 LAB — BASIC METABOLIC PANEL
Anion gap: 11 (ref 5–15)
BUN: 19 mg/dL (ref 8–23)
CO2: 24 mmol/L (ref 22–32)
Calcium: 8.9 mg/dL (ref 8.9–10.3)
Chloride: 100 mmol/L (ref 98–111)
Creatinine, Ser: 0.9 mg/dL (ref 0.61–1.24)
GFR calc Af Amer: >60 mL/min (ref >60)
GFR calc non Af Amer: >60 mL/min (ref >60)
Glucose, Bld: 116 mg/dL — ABNORMAL HIGH (ref 70–99)
Potassium: 3.9 mmol/L (ref 3.5–5.1)
Sodium: 135 mmol/L (ref 135–145)

## 2018-09-15 LAB — CBG MONITORING, ED: Glucose-Capillary: 109 mg/dL — ABNORMAL HIGH (ref 70–99)

## 2018-09-15 MED ORDER — SODIUM CHLORIDE 0.9 % IV BOLUS
500.0000 mL | Freq: Once | INTRAVENOUS | Status: AC
  Administered 2018-09-15: 500 mL via INTRAVENOUS

## 2018-09-15 NOTE — ED Triage Notes (Signed)
Pt from home via ems; wife found pt in between toilet and sink this am, pt incontinent of feces; loc; BP 78 palp on ems arrival; pt given 250 ns pta; contusions to L head; not on thinners   CBG 128 149/85 HR 84 sinus RR 18 94% RA  Daughters  Olegario Messier (call for pick up) [phone] Joy [phone]

## 2018-09-15 NOTE — ED Notes (Signed)
Updated pt's daughter Olegario Messier) on pt's status and plan of care  Cell: [phone]

## 2018-09-15 NOTE — ED Notes (Signed)
Patient verbalizes understanding of discharge instructions. Opportunity for questioning and answers were provided. Pt discharged from ED. 

## 2018-09-15 NOTE — ED Notes (Signed)
Patient transported to CT 

## 2018-09-15 NOTE — ED Provider Notes (Signed)
[facility] EMERGENCY DEPARTMENT Provider Note   CSN: [id number] Arrival date & time: 09/15/18  1045    History   Chief Complaint Chief Complaint  Patient presents with   Loss of Consciousness   Hypotension    HPI ROZELL THEILER is a 85 y.o. male.  HPI   89yM presenting after fall. Fell in bathroom. Struck head. Doesn't think had LOC. Mild facial pain/HA. No acute visual changes, numbness, tingling, loss of strength or nausea. No blood thinners. Felt fine prior to fall. Not exactly sure why he fell but denies preceding symptoms.   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   Murmur    noted to have loud murmur   Right bundle branch block    Weakness    chronic weakness without syncope    Patient Active Problem List   Diagnosis Date Noted   Mitral valvular regurgitation 08/17/2016   HOCM (hypertrophic obstructive cardiomyopathy) (HCC) 09/14/2010   RBBB (right bundle branch block) 09/14/2010   Hypothyroidism 09/14/2010   Benign hypertensive heart disease without heart failure 09/14/2010    Past Surgical History:  Procedure Laterality Date   TRANSTHORACIC ECHOCARDIOGRAM  04/2010   Vigorous LV function with systolic anterior motion of the anterior mitral valve mild dynamic LVOT obstruction with peak gradient 70 mmHg. EF 65-70%. GR 1 DD. Moderate-severe MR        Home Medications    Prior to Admission medications   Medication Sig Start Date End Date Taking? Authorizing Provider  Biotin 2500  MCG CAPS Take 2,500 mcg by mouth daily.    [provider]  Cholecalciferol (VITAMIN D3) 2000 UNITS capsule Take 2,000 Units by mouth. occasionally     [provider]  diclofenac sodium (VOLTAREN) 1 % GEL Apply 2 g topically daily as needed (for pain).     [provider]  furosemide (LASIX) 20 MG tablet Take 20 mg by mouth daily.     [provider]  hydrocortisone 1 % lotion Apply 1 application topically daily. As directed     [provider]  losartan (COZAAR) 100 MG tablet TAKE 1 TABLET (100 MG TOTAL) BY MOUTH DAILY. 05/06/16   Marykay Lex, MD  metoprolol tartrate (LOPRESSOR) 25 MG tablet TAKE 1/2 TABLET BY MOUTH TWICE A DAY 08/25/17   Marykay Lex, MD  SYNTHROID 50 MCG tablet Take 50 mcg by mouth daily before breakfast. 06/08/15   [provider]  vitamin B-12 (CYANOCOBALAMIN) 250 MCG tablet Take 250 mcg by mouth daily.      [provider]    Family History Family History  Problem Relation Age of Onset   Diabetes Sister     Social History Social History   Tobacco Use   Smoking status: Never Smoker   Smokeless tobacco: Never Used  Substance Use Topics   Alcohol use: No   Drug use: No     Allergies  Augmentin [amoxicillin-pot clavulanate] and Hydrocodone bitartrate [hydrocodone]   Review of Systems Review of Systems   All systems reviewed and negative, other than as noted in HPI.   Updated Vital Signs BP (!) 167/72    Pulse 77    Temp 98.6 F (37 C) (Oral)    Resp (!) 23    Ht 5\' 6"  (1.676 m)    Wt 63.5 kg    SpO2 96%    BMI 22.60 kg/m   Physical Exam Vitals signs and nursing note reviewed.  Constitutional:      General: He is not in acute distress.    Appearance: He is well-developed.  HENT:     Head: Normocephalic.     Comments: Forehead/scalp contusions Eyes:     General:        Right eye: No discharge.        Left eye: No discharge.     Conjunctiva/sclera: Conjunctivae normal.    Neck:     Musculoskeletal: Neck supple.  Cardiovascular:     Rate and Rhythm: Normal rate and regular rhythm.     Heart sounds: Normal heart sounds. No murmur. No friction rub. No gallop.   Pulmonary:     Effort: Pulmonary effort is normal. No respiratory distress.     Breath sounds: Normal breath sounds.  Abdominal:     General: There is no distension.     Palpations: Abdomen is soft.     Tenderness: There is no abdominal tenderness.  Musculoskeletal:        General: No tenderness.     Comments: No midline spinal tenderness. Mild L lateral neck tenderness.   Skin:    General: Skin is warm and dry.  Neurological:     Mental Status: He is alert.     Cranial Nerves: No cranial nerve deficit.     Sensory: No sensory deficit.     Motor: No weakness.  Psychiatric:        Behavior: Behavior normal.        Thought Content: Thought content normal.      ED Treatments / Results  Labs (all labs ordered are listed, but only abnormal results are displayed) Labs Reviewed  CBC WITH DIFFERENTIAL/PLATELET - Abnormal; Notable for the following components:      Result Value   RBC 3.97 (*)    Hemoglobin 12.8 (*)    Neutro Abs 8.6 (*)    Lymphs Abs 0.6 (*)    Monocytes Absolute 1.2 (*)    All other components within normal limits  BASIC METABOLIC PANEL - Abnormal; Notable for the following components:   Glucose, Bld 116 (*)    All other components within normal limits  CBG MONITORING, ED - Abnormal; Notable for the following components:   Glucose-Capillary 109 (*)    All other components within normal limits  URINALYSIS, ROUTINE W REFLEX MICROSCOPIC    EKG EKG Interpretation  Date/Time:  Tuesday Sep 15 2018 10:52:45 EDT Ventricular Rate:  73 PR Interval:    QRS Duration: 137 QT Interval:  410 QTC Calculation: 452 R Axis:   -38 Text Interpretation:  Sinus rhythm Right bundle branch block Confirmed by Raeford RazorKohut, Leslyn Monda [phone]([id number]) on 09/15/2018 11:19:59 AM   Radiology Ct Head Wo  Contrast  Result Date: 09/15/2018 CLINICAL DATA:  [age] year old male with a headache after trauma EXAM: CT HEAD WITHOUT CONTRAST CT CERVICAL SPINE WITHOUT CONTRAST TECHNIQUE: Multidetector CT imaging of the head and cervical spine was performed following the standard protocol without  intravenous contrast. Multiplanar CT image reconstructions of the cervical spine were also generated. COMPARISON:  None. FINDINGS: CT HEAD FINDINGS Brain: No acute intracranial hemorrhage. No midline shift or mass effect. Gray-white differentiation maintained. Senescent volume loss. Patchy hypodensities in the periventricular white matter. Unremarkable appearance of the ventricular system. Vascular: Intracranial atherosclerosis Skull: No acute fracture.  No aggressive bone lesion identified. Sinuses/Orbits: Unremarkable appearance of the orbits. Mastoid air cells clear. No middle ear effusion. No significant sinus disease. Other: No hematoma CT CERVICAL SPINE FINDINGS Alignment: Accentuated lordosis. No subluxation. No anterolisthesis or retrolisthesis. Facets maintain alignment. Skull base and vertebrae: No skull base fracture. Craniocervical junction aligned. Soft tissues and spinal canal: Carotid calcifications. Heterogeneous right thyroid goiter with internal calcifications. No lymphadenopathy. Disc levels: Disc space narrowing with endplate sclerosis anterior osteophyte production and uncovertebral joint disease throughout the cervical spine. Vacuum disc phenomenon at C3-C4, C5-C6, C6-it has C7. Ankylosis of T1-T2. Varying degrees of foraminal narrowing of the cervical spine secondary to uncovertebral joint disease and facet spurring. Upper chest: Unremarkable appearance of the lung apices Other: None IMPRESSION: Head CT: No acute intracranial abnormality. Senescent volume loss and chronic microvascular ischemic disease. Cervical CT: No acute fracture or malalignment of the cervical spine. Degenerative disc disease. Carotid  calcifications Electronically Signed   By: Gilmer Mor D.O.   On: 09/15/2018 11:48   Ct Cervical Spine Wo Contrast  Result Date: 09/15/2018 CLINICAL DATA:  85 year old male with a headache after trauma EXAM: CT HEAD WITHOUT CONTRAST CT CERVICAL SPINE WITHOUT CONTRAST TECHNIQUE: Multidetector CT imaging of the head and cervical spine was performed following the standard protocol without intravenous contrast. Multiplanar CT image reconstructions of the cervical spine were also generated. COMPARISON:  None. FINDINGS: CT HEAD FINDINGS Brain: No acute intracranial hemorrhage. No midline shift or mass effect. Gray-white differentiation maintained. Senescent volume loss. Patchy hypodensities in the periventricular white matter. Unremarkable appearance of the ventricular system. Vascular: Intracranial atherosclerosis Skull: No acute fracture.  No aggressive bone lesion identified. Sinuses/Orbits: Unremarkable appearance of the orbits. Mastoid air cells clear. No middle ear effusion. No significant sinus disease. Other: No hematoma CT CERVICAL SPINE FINDINGS Alignment: Accentuated lordosis. No subluxation. No anterolisthesis or retrolisthesis. Facets maintain alignment. Skull base and vertebrae: No skull base fracture. Craniocervical junction aligned. Soft tissues and spinal canal: Carotid calcifications. Heterogeneous right thyroid goiter with internal calcifications. No lymphadenopathy. Disc levels: Disc space narrowing with endplate sclerosis anterior osteophyte production and uncovertebral joint disease throughout the cervical spine. Vacuum disc phenomenon at C3-C4, C5-C6, C6-it has C7. Ankylosis of T1-T2. Varying degrees of foraminal narrowing of the cervical spine secondary to uncovertebral joint disease and facet spurring. Upper chest: Unremarkable appearance of the lung apices Other: None IMPRESSION: Head CT: No acute intracranial abnormality. Senescent volume loss and chronic microvascular ischemic disease.  Cervical CT: No acute fracture or malalignment of the cervical spine. Degenerative disc disease. Carotid calcifications Electronically Signed   By: Gilmer Mor D.O.   On: 09/15/2018 11:48    Procedures Procedures (including critical care time)  Medications Ordered in ED Medications  sodium chloride 0.9 % bolus 500 mL (0 mLs Intravenous Stopped 09/15/18 1245)     Initial Impression / Assessment and Plan / ED Course  I have reviewed the triage vital signs and the nursing notes.  Pertinent labs & imaging results that were available during my care of the patient were reviewed by me and considered in my medical decision making (see chart for details).  89yM presenting after fall. Some minor facial/scalp contusions. Baseline neurologically. Reassuring imaging. Basic labs/UA ok. I doubt emergent process as a result of or causing falls.   Final Clinical Impressions(s) / ED Diagnoses   Final diagnoses:  Fall, initial encounter  Contusion of face, initial encounter  Minor head injury, initial encounter    ED Discharge Orders    None       Raeford Razor, MD 09/16/18 1303

## 2018-09-15 NOTE — ED Notes (Signed)
Pt. Requested we call his wife to give her an update.  Called wife to update her, wife stated, they called my daughter and she is the one who will pick him up whenever he is ready. Thank you

## 2019-01-04 ENCOUNTER — Inpatient Hospital Stay (HOSPITAL_COMMUNITY)
Admission: EM | Admit: 2019-01-04 | Discharge: 2019-01-12 | DRG: 871 | Disposition: A | Discharge status: Hospice | Payer: Medicare Other | Attending: Family Medicine | Admitting: Family Medicine

## 2019-01-04 ENCOUNTER — Emergency Department (HOSPITAL_COMMUNITY): Payer: Medicare Other

## 2019-01-04 ENCOUNTER — Other Ambulatory Visit: Payer: Self-pay

## 2019-01-04 DIAGNOSIS — Z515 Encounter for palliative care: Secondary | ICD-10-CM | POA: Diagnosis not present

## 2019-01-04 DIAGNOSIS — Z79899 Other long term (current) drug therapy: Secondary | ICD-10-CM

## 2019-01-04 DIAGNOSIS — F0280 Dementia in other diseases classified elsewhere without behavioral disturbance: Secondary | ICD-10-CM | POA: Diagnosis present

## 2019-01-04 DIAGNOSIS — I340 Nonrheumatic mitral (valve) insufficiency: Secondary | ICD-10-CM | POA: Diagnosis present

## 2019-01-04 DIAGNOSIS — R410 Disorientation, unspecified: Secondary | ICD-10-CM | POA: Diagnosis not present

## 2019-01-04 DIAGNOSIS — Z20828 Contact with and (suspected) exposure to other viral communicable diseases: Secondary | ICD-10-CM | POA: Diagnosis present

## 2019-01-04 DIAGNOSIS — R4182 Altered mental status, unspecified: Secondary | ICD-10-CM | POA: Diagnosis present

## 2019-01-04 DIAGNOSIS — R269 Unspecified abnormalities of gait and mobility: Secondary | ICD-10-CM | POA: Diagnosis present

## 2019-01-04 DIAGNOSIS — G9341 Metabolic encephalopathy: Secondary | ICD-10-CM | POA: Diagnosis present

## 2019-01-04 DIAGNOSIS — I10 Essential (primary) hypertension: Secondary | ICD-10-CM | POA: Diagnosis present

## 2019-01-04 DIAGNOSIS — Z66 Do not resuscitate: Secondary | ICD-10-CM | POA: Diagnosis present

## 2019-01-04 DIAGNOSIS — J690 Pneumonitis due to inhalation of food and vomit: Secondary | ICD-10-CM | POA: Diagnosis present

## 2019-01-04 DIAGNOSIS — Z833 Family history of diabetes mellitus: Secondary | ICD-10-CM | POA: Diagnosis not present

## 2019-01-04 DIAGNOSIS — I119 Hypertensive heart disease without heart failure: Secondary | ICD-10-CM | POA: Diagnosis present

## 2019-01-04 DIAGNOSIS — E860 Dehydration: Secondary | ICD-10-CM | POA: Diagnosis present

## 2019-01-04 DIAGNOSIS — R296 Repeated falls: Secondary | ICD-10-CM | POA: Diagnosis present

## 2019-01-04 DIAGNOSIS — I959 Hypotension, unspecified: Secondary | ICD-10-CM | POA: Diagnosis not present

## 2019-01-04 DIAGNOSIS — Z881 Allergy status to other antibiotic agents status: Secondary | ICD-10-CM | POA: Diagnosis not present

## 2019-01-04 DIAGNOSIS — Z79890 Hormone replacement therapy (postmenopausal): Secondary | ICD-10-CM

## 2019-01-04 DIAGNOSIS — R6520 Severe sepsis without septic shock: Secondary | ICD-10-CM | POA: Diagnosis not present

## 2019-01-04 DIAGNOSIS — A419 Sepsis, unspecified organism: Secondary | ICD-10-CM | POA: Diagnosis present

## 2019-01-04 DIAGNOSIS — Z885 Allergy status to narcotic agent status: Secondary | ICD-10-CM | POA: Diagnosis not present

## 2019-01-04 DIAGNOSIS — I4510 Unspecified right bundle-branch block: Secondary | ICD-10-CM | POA: Diagnosis present

## 2019-01-04 DIAGNOSIS — G9340 Encephalopathy, unspecified: Secondary | ICD-10-CM | POA: Diagnosis present

## 2019-01-04 DIAGNOSIS — G7281 Critical illness myopathy: Secondary | ICD-10-CM | POA: Diagnosis not present

## 2019-01-04 DIAGNOSIS — F0390 Unspecified dementia without behavioral disturbance: Secondary | ICD-10-CM

## 2019-01-04 DIAGNOSIS — E039 Hypothyroidism, unspecified: Secondary | ICD-10-CM | POA: Diagnosis present

## 2019-01-04 DIAGNOSIS — A021 Salmonella sepsis: Secondary | ICD-10-CM | POA: Diagnosis not present

## 2019-01-04 DIAGNOSIS — I421 Obstructive hypertrophic cardiomyopathy: Secondary | ICD-10-CM | POA: Diagnosis present

## 2019-01-04 LAB — CBC WITH DIFFERENTIAL/PLATELET
Abs Immature Granulocytes: 0.08 10*3/uL — ABNORMAL HIGH (ref 0.00–0.07)
Basophils Absolute: 0.1 10*3/uL (ref 0.0–0.1)
Basophils Relative: 0 %
Eosinophils Absolute: 0 10*3/uL (ref 0.0–0.5)
Eosinophils Relative: 0 %
HCT: 42.9 % (ref 39.0–52.0)
Hemoglobin: 14.3 g/dL (ref 13.0–17.0)
Immature Granulocytes: 1 %
Lymphocytes Relative: 2 %
Lymphs Abs: 0.3 10*3/uL — ABNORMAL LOW (ref 0.7–4.0)
MCH: 32.9 pg (ref 26.0–34.0)
MCHC: 33.3 g/dL (ref 30.0–36.0)
MCV: 98.6 fL (ref 80.0–100.0)
Monocytes Absolute: 1.1 10*3/uL — ABNORMAL HIGH (ref 0.1–1.0)
Monocytes Relative: 7 %
Neutro Abs: 13.3 10*3/uL — ABNORMAL HIGH (ref 1.7–7.7)
Neutrophils Relative %: 90 %
Platelets: 218 10*3/uL (ref 150–400)
RBC: 4.35 MIL/uL (ref 4.22–5.81)
RDW: 12.5 % (ref 11.5–15.5)
WBC: 14.8 10*3/uL — ABNORMAL HIGH (ref 4.0–10.5)
nRBC: 0 % (ref 0.0–0.2)

## 2019-01-04 LAB — URINALYSIS, ROUTINE W REFLEX MICROSCOPIC
Bilirubin Urine: NEGATIVE
Glucose, UA: NEGATIVE mg/dL
Hgb urine dipstick: NEGATIVE
Ketones, ur: 20 mg/dL — AB
Leukocytes,Ua: NEGATIVE
Nitrite: NEGATIVE
Protein, ur: 100 mg/dL — AB
Specific Gravity, Urine: 1.019 (ref 1.005–1.030)
pH: 7 (ref 5.0–8.0)

## 2019-01-04 LAB — COMPREHENSIVE METABOLIC PANEL
ALT: 20 U/L (ref 0–44)
AST: 23 U/L (ref 15–41)
Albumin: 3.3 g/dL — ABNORMAL LOW (ref 3.5–5.0)
Alkaline Phosphatase: 76 U/L (ref 38–126)
Anion gap: 11 (ref 5–15)
BUN: 16 mg/dL (ref 8–23)
CO2: 21 mmol/L — ABNORMAL LOW (ref 22–32)
Calcium: 8.7 mg/dL — ABNORMAL LOW (ref 8.9–10.3)
Chloride: 104 mmol/L (ref 98–111)
Creatinine, Ser: 0.75 mg/dL (ref 0.61–1.24)
GFR calc Af Amer: >60 mL/min (ref >60)
GFR calc non Af Amer: >60 mL/min (ref >60)
Glucose, Bld: 118 mg/dL — ABNORMAL HIGH (ref 70–99)
Potassium: 3.8 mmol/L (ref 3.5–5.1)
Sodium: 136 mmol/L (ref 135–145)
Total Bilirubin: 1.3 mg/dL — ABNORMAL HIGH (ref 0.3–1.2)
Total Protein: 5.9 g/dL — ABNORMAL LOW (ref 6.5–8.1)

## 2019-01-04 LAB — I-STAT CHEM 8, ED
BUN: 21 mg/dL (ref 8–23)
Calcium, Ion: 1.12 mmol/L — ABNORMAL LOW (ref 1.15–1.40)
Chloride: 101 mmol/L (ref 98–111)
Creatinine, Ser: 0.6 mg/dL — ABNORMAL LOW (ref 0.61–1.24)
Glucose, Bld: 116 mg/dL — ABNORMAL HIGH (ref 70–99)
HCT: 42 % (ref 39.0–52.0)
Hemoglobin: 14.3 g/dL (ref 13.0–17.0)
Potassium: 4.1 mmol/L (ref 3.5–5.1)
Sodium: 137 mmol/L (ref 135–145)
TCO2: 26 mmol/L (ref 22–32)

## 2019-01-04 LAB — PROTIME-INR
INR: 1 (ref 0.8–1.2)
Prothrombin Time: 13.1 seconds (ref 11.4–15.2)

## 2019-01-04 LAB — RAPID URINE DRUG SCREEN, HOSP PERFORMED
Amphetamines: NOT DETECTED
Barbiturates: NOT DETECTED
Benzodiazepines: POSITIVE — AB
Cocaine: NOT DETECTED
Opiates: NOT DETECTED
Tetrahydrocannabinol: NOT DETECTED

## 2019-01-04 LAB — LACTIC ACID, PLASMA
Lactic Acid, Venous: 2.1 mmol/L (ref 0.5–1.9)
Lactic Acid, Venous: 1.4 mmol/L (ref 0.5–1.9)

## 2019-01-04 LAB — SARS CORONAVIRUS 2 BY RT PCR (HOSPITAL ORDER, PERFORMED IN ~~LOC~~ HOSPITAL LAB): SARS Coronavirus 2: NEGATIVE

## 2019-01-04 LAB — APTT: aPTT: 36 seconds (ref 24–36)

## 2019-01-04 LAB — ETHANOL: Alcohol, Ethyl (B): <10 mg/dL (ref <10)

## 2019-01-04 LAB — CBG MONITORING, ED: Glucose-Capillary: 112 mg/dL — ABNORMAL HIGH (ref 70–99)

## 2019-01-04 LAB — D-DIMER, QUANTITATIVE: D-Dimer, Quant: 0.85 ug/mL-FEU — ABNORMAL HIGH (ref 0.00–0.50)

## 2019-01-04 LAB — PROCALCITONIN: Procalcitonin: <0.1 ng/mL

## 2019-01-04 MED ORDER — ONDANSETRON HCL 4 MG PO TABS: 4.0000 mg | ORAL_TABLET | Freq: Four times a day (QID) | ORAL | Status: DC | PRN

## 2019-01-04 MED ORDER — SODIUM CHLORIDE 0.9 % IV SOLN
2.0000 g | Freq: Once | INTRAVENOUS | Status: AC
  Administered 2019-01-04: 2 g via INTRAVENOUS

## 2019-01-04 MED ORDER — SODIUM CHLORIDE 0.9 % IV SOLN
INTRAVENOUS | Status: DC
  Administered 2019-01-04 – 2019-01-05 (×2): via INTRAVENOUS

## 2019-01-04 MED ORDER — METRONIDAZOLE IN NACL 5-0.79 MG/ML-% IV SOLN
500.0000 mg | Freq: Once | INTRAVENOUS | Status: AC
  Administered 2019-01-04: 500 mg via INTRAVENOUS
  Filled 2019-01-04: qty 100

## 2019-01-04 MED ORDER — SODIUM CHLORIDE 0.9 % IV SOLN
2.0000 g | Freq: Two times a day (BID) | INTRAVENOUS | Status: DC
  Administered 2019-01-05 – 2019-01-06 (×3): 2 g via INTRAVENOUS
  Filled 2019-01-04 (×4): qty 2

## 2019-01-04 MED ORDER — SODIUM CHLORIDE 0.9 % IV BOLUS
1000.0000 mL | INTRAVENOUS | Status: AC
  Administered 2019-01-04: 15:00:00 1000 mL via INTRAVENOUS

## 2019-01-04 MED ORDER — ACETAMINOPHEN 650 MG RE SUPP: 650.0000 mg | Freq: Four times a day (QID) | RECTAL | Status: DC | PRN

## 2019-01-04 MED ORDER — VANCOMYCIN HCL IN DEXTROSE 1-5 GM/200ML-% IV SOLN
1000.0000 mg | Freq: Once | INTRAVENOUS | Status: AC
  Administered 2019-01-04: 18:00:00 1000 mg via INTRAVENOUS
  Filled 2019-01-04: qty 200

## 2019-01-04 MED ORDER — LEVOTHYROXINE SODIUM 100 MCG/5ML IV SOLN
25.0000 ug | Freq: Every day | INTRAVENOUS | Status: DC
  Administered 2019-01-05 – 2019-01-10 (×6): 25 ug via INTRAVENOUS
  Filled 2019-01-04 (×7): qty 5

## 2019-01-04 MED ORDER — CALCIUM GLUCONATE-NACL 1-0.675 GM/50ML-% IV SOLN
1.0000 g | Freq: Once | INTRAVENOUS | Status: AC
  Administered 2019-01-04: 20:00:00 1000 mg via INTRAVENOUS
  Filled 2019-01-04: qty 50

## 2019-01-04 MED ORDER — SODIUM CHLORIDE 0.9% FLUSH
3.0000 mL | Freq: Two times a day (BID) | INTRAVENOUS | Status: DC
  Administered 2019-01-04 – 2019-01-11 (×13): 3 mL via INTRAVENOUS

## 2019-01-04 MED ORDER — ORAL CARE MOUTH RINSE
15.0000 mL | Freq: Two times a day (BID) | OROMUCOSAL | Status: DC
  Administered 2019-01-05 – 2019-01-11 (×11): 15 mL via OROMUCOSAL

## 2019-01-04 MED ORDER — PANTOPRAZOLE SODIUM 40 MG IV SOLR
40.0000 mg | INTRAVENOUS | Status: DC
  Administered 2019-01-04 – 2019-01-09 (×5): 40 mg via INTRAVENOUS
  Filled 2019-01-04 (×7): qty 40

## 2019-01-04 MED ORDER — ONDANSETRON HCL 4 MG/2ML IJ SOLN: 4.0000 mg | Freq: Four times a day (QID) | INTRAMUSCULAR | Status: DC | PRN

## 2019-01-04 MED ORDER — ACETAMINOPHEN 325 MG PO TABS
650.0000 mg | ORAL_TABLET | Freq: Four times a day (QID) | ORAL | Status: DC | PRN
  Administered 2019-01-09: 650 mg via ORAL
  Filled 2019-01-04: qty 2

## 2019-01-04 MED ORDER — SODIUM CHLORIDE 0.9 % IV BOLUS: 1000.0000 mL | Freq: Once | INTRAVENOUS | Status: DC

## 2019-01-04 MED ORDER — ACETAMINOPHEN 160 MG/5ML PO SOLN: 650.0000 mg | Freq: Once | ORAL | Status: DC

## 2019-01-04 MED ORDER — ENOXAPARIN SODIUM 40 MG/0.4ML ~~LOC~~ SOLN
40.0000 mg | SUBCUTANEOUS | Status: DC
  Administered 2019-01-05 – 2019-01-11 (×7): 40 mg via SUBCUTANEOUS
  Filled 2019-01-04 (×8): qty 0.4

## 2019-01-04 MED ORDER — ALBUTEROL SULFATE (2.5 MG/3ML) 0.083% IN NEBU: 2.5000 mg | INHALATION_SOLUTION | Freq: Four times a day (QID) | RESPIRATORY_TRACT | Status: DC | PRN

## 2019-01-04 MED ORDER — SODIUM CHLORIDE 0.9 % IV BOLUS
500.0000 mL | Freq: Once | INTRAVENOUS | Status: AC
  Administered 2019-01-04: 500 mL via INTRAVENOUS

## 2019-01-04 MED ORDER — VANCOMYCIN HCL IN DEXTROSE 750-5 MG/150ML-% IV SOLN
750.0000 mg | INTRAVENOUS | Status: DC
  Filled 2019-01-04: qty 150

## 2019-01-04 NOTE — ED Notes (Signed)
Pt difficulty swallowing on oral secretions, continued need for suctioning.

## 2019-01-04 NOTE — ED Notes (Addendum)
Daughter: Jeremy Johann can be reached at [phone]  Daughter Cherlynn Kaiser can be reached at [phone] - POA

## 2019-01-04 NOTE — ED Notes (Signed)
First lactic collected at 17:55.   Order for second lactic placed at this time, per standing orders

## 2019-01-04 NOTE — Progress Notes (Addendum)
Pharmacy Antibiotic Note  Casey Andrade is a 85 y.o. male admitted on 01/04/2019 with sepsis.  Febrile, WBC elevated 14.8, Scr 0.6. Received Flagyl 500 mg IV once in the ED. Pharmacy has been consulted for vancomycin and cefepime dosing.  Plan: Cefepime 2 g IV once Vancomycin 1 g IV once Cefepime 2 g IV q12h  Vancomycin 750 mg IV q24h (estAUC 500, goal AUC 400-550, Scr used 0.8) Monitor renal function Monitor C/s  Height: 5\' 6"  (167.6 cm) IBW/kg (Calculated) : 63.8  Temp (24hrs), Avg:100.8 F (38.2 C), Min:100.8 F (38.2 C), Max:100.8 F (38.2 C)  Recent Labs  Lab 01/04/19 1300 01/04/19 1308  WBC 14.8*  --   CREATININE 0.75 0.60*    CrCl cannot be calculated (Unknown ideal weight.).    Allergies  Allergen Reactions  . Augmentin [Amoxicillin-Pot Clavulanate]     Doesn't recall  . Hydrocodone Bitartrate [Hydrocodone]     Doesn't recall    Antimicrobials this admission: Flagyl 9/14 >> Cefepime 9/14 >> Vancomycin 9/14 >>  Microbiology results: 9/14 COVID: pending    Thank you for allowing pharmacy to be a part of this patient's care.   Lorel Monaco, PharmD PGY1 Ambulatory Care Resident [employer] # [phone]

## 2019-01-04 NOTE — ED Notes (Signed)
Returns from CT, remains unresponsive and need to suction yellow secretions.  Neuro in with daughter for further eval.  Has bilateral weakness and no facial assymetry.  Out of TPA window.

## 2019-01-04 NOTE — ED Provider Notes (Signed)
Medical screening examination/treatment/procedure(s) were conducted as a shared visit with non-physician practitioner(s) and myself.  I personally evaluated the patient during the encounter.  Clinical Impression:   Final diagnoses:  None    The patient is a 85 year old male coming from a nursing facility.  Today he was supposed to go from the nursing facility to an assisted care however he had a witnessed event where he essentially went unresponsive in front of staff.  He is requiring a nonrebreather, on exam the patient appears to be flaccid on the left and have some muscle tone on the right.  He is not following commands, he is difficult to arouse, he has copious secretions in the occasional cough.  Suspect that the patient has possibly had a stroke, would also consider aspiration, will consult with neurology and activate a code stroke given the approximate timeframe since onset.  This started approximately 1 to 1-1/2 hours ago.  This was witnessed by staff at the nursing facility.  I have discussed this with the daughter at the bedside who is consulting with family about possibility for thrombolytics if he would qualify.  Level 5 caveat applies secondary to the patient's severe change in mental status and unresponsiveness  .Critical Care Performed by: Noemi Chapel, MD Authorized by: Noemi Chapel, MD   Critical care provider statement:    Critical care time (minutes):  35   Critical care time was exclusive of:  Separately billable procedures and treating other patients and teaching time   Critical care was necessary to treat or prevent imminent or life-threatening deterioration of the following conditions:  CNS failure or compromise   Critical care was time spent personally by me on the following activities:  Blood draw for specimens, development of treatment plan with patient or surrogate, discussions with consultants, evaluation of patient's response to treatment, examination of  patient, obtaining history from patient or surrogate, ordering and performing treatments and interventions, ordering and review of laboratory studies, ordering and review of radiographic studies, pulse oximetry, re-evaluation of patient's condition and review of old charts      Noemi Chapel, MD 01/05/19 [phone]

## 2019-01-04 NOTE — H&P (Signed)
History and Physical    Casey Andrade Odowd ZOX:[id number] DOB: March 29, 1920 DOA: 01/04/2019  Referring MD/NP/PA: Valetta MoleJoanna Soto, PA-C PCP: Catha GosselinLittle, Kevin, MD  Patient coming from: via EMS  Chief Complaint: Altered mental status  I [facility] personally briefly [facility]   HPI: Casey Andrade Fabio is a 85 y.o. male with medical history significant of hypertension, hypertrophic cardiomyopathy last EF 65 to 70%, dementia, hypothyroidism, hyponatremia, and gait disturbance; who presents after being found acutely altered.  History is obtained from the patient's daughter who is present at bedside and my review of records as he is acutely altered and unable to give his history at this time.  Him and his wife were supposed to go to an assisted care facility today, but he was found to be unresponsive in front of staff and not following commands around 830 this morning.  Previously, noted to be lethargic but arousable and speaking 15 minutes prior.  Upon EMS arrival he was initially placed on a nonrebreather and patient appeared flaccid on the left side with some muscle tone 8 reported on the right.  Initially suspected stroke versus aspiration.  Daughter reports that patient has had chronic productive cough, but had increased sputum production although reported to be clear in the last week or so.  Otherwise patient had been noted to be in fairly decent health for his age and utilized a rolling walker to ambulate as he was high falls risk.  Patient has been given Haldol yesterday afternoon around 4:30 p.Andrade. daughter notes that when he was previously given Haldol he slept for over 24 hours thereafter.   ED Course: On admission into the emergency department patient was found to be febrile up to 100.8 F rectally, respirations 16-26, blood pressure noted as 97/45, and O2 saturation maintained currently on 1 L nasal cannula oxygen. CT scan of the brain was negative for any acute  abnormalities, and code stroke had been canceled after patient was evaluated by neurology.  Chest x-ray showed no acute abnormalities. Labs significant for WBC 14.8.  COVID-19 negative.  No initial lactic acid was obtained.  Patient was started on empiric antibiotics of vancomycin, metronidazole, and cefepime.  Review of Systems  Unable to perform ROS: Patient unresponsive  Psychiatric/Behavioral: Positive for memory loss.    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   Murmur    noted to have loud murmur   Right bundle branch block    Weakness    chronic weakness without syncope    Past Surgical History:  Procedure Laterality Date   TRANSTHORACIC ECHOCARDIOGRAM  04/2010   Vigorous LV function with systolic anterior motion of the anterior mitral valve mild dynamic LVOT obstruction with peak gradient 70 mmHg. EF 65-70%. GR 1 DD. Moderate-severe MR     reports that he has never smoked. He has never used smokeless tobacco. He reports that he does not drink alcohol  or use drugs.  Allergies  Allergen Reactions   Augmentin [Amoxicillin-Pot Clavulanate]     Doesn't recall   Hydrocodone Bitartrate [Hydrocodone]     Doesn't recall    Family History  Problem Relation Age of Onset   Diabetes Sister     Prior to Admission medications   Medication Sig Start Date End Date Taking? Authorizing Provider  Biotin 2500 MCG CAPS Take 2,500 mcg by mouth daily.    [provider]  Cholecalciferol (VITAMIN D3) 2000 UNITS capsule Take  2,000 Units by mouth. occasionally     [provider]  diclofenac sodium (VOLTAREN) 1 % GEL Apply 2 g topically daily as needed (for pain).     [provider]  furosemide (LASIX) 20 MG tablet Take 20 mg by mouth daily.     [provider]  hydrocortisone 1 % lotion Apply 1 application topically daily. As directed     [provider]  losartan (COZAAR) 100 MG tablet TAKE 1 TABLET (100 MG TOTAL) BY MOUTH DAILY. 05/06/16   Marykay Lex, MD  metoprolol tartrate (LOPRESSOR) 25 MG tablet TAKE 1/2 TABLET BY MOUTH TWICE A DAY Patient taking differently: Take 12.5 mg by mouth 2 (two) times daily.  08/25/17   Marykay Lex, MD  SYNTHROID 50 MCG tablet Take 50 mcg by mouth daily before breakfast. 06/08/15   [provider]  vitamin B-12 (CYANOCOBALAMIN) 250 MCG tablet Take 250 mcg by mouth daily.      [provider]    Physical Exam:  Constitutional: Elderly male who appears to be unresponsive. Vitals:   01/04/19 1143 01/04/19 1145 01/04/19 1358  BP:  (!) 118/53   Pulse:  94   Resp:  17   Temp:   (!) 100.8 F (38.2 C)  TempSrc:   Rectal  SpO2:  96%   Height: 5\' 6"  (1.676 Andrade)     Eyes: PERRL, lids and conjunctivae normal ENMT: Mucous membranes are dry. Posterior pharynx clear of any exudate or lesions.  Neck: normal, supple, no masses, no thyromegaly Respiratory: Normal respiratory effort with no significant wheezes or rhonchi appreciated.  O2 saturation currently maintained on 1 L of nasal cannula oxygen. Cardiovascular: Regular rate and rhythm, +3/6 systolic ejection murmur appreciated.  No extremity edema. 2+ pedal pulses. No carotid bruits.  Abdomen: no tenderness, no masses palpated. No hepatosplenomegaly. Bowel sounds positive.  Musculoskeletal: no clubbing / cyanosis.  Normal tone. Skin: no rashes, lesions, ulcers. No induration Neurologic: Cranial nerves II-XII appear grossly intact.  Strength 2/5 in upper and lower extremities  at this time Psychiatric: Lethargic not responding to verbal commands.    Labs on Admission: I have personally reviewed following labs and imaging studies  CBC: Recent Labs  Lab 01/04/19 1300 01/04/19 1308  WBC 14.8*  --   NEUTROABS 13.3*  --   HGB 14.3 14.3  HCT 42.9 42.0  MCV 98.6  --   PLT 218  --    Basic Metabolic Panel: Recent Labs  Lab 01/04/19 1300 01/04/19 1308  NA 136 137  K 3.8 4.1  CL 104 101  CO2 21*  --   GLUCOSE 118* 116*  BUN 16 21  CREATININE 0.75 0.60*  CALCIUM 8.7*  --    GFR: CrCl cannot be calculated (Unknown ideal weight.). Liver Function Tests: Recent Labs  Lab 01/04/19 1300  AST 23  ALT 20  ALKPHOS 76  BILITOT 1.3*  PROT 5.9*  ALBUMIN 3.3*   No results for  input(s): LIPASE, AMYLASE in the last 168 hours. No results for input(s): AMMONIA in the last 168 hours. Coagulation Profile: Recent Labs  Lab 01/04/19 1300  INR 1.0   Cardiac Enzymes: No results for input(s): CKTOTAL, CKMB, CKMBINDEX, TROPONINI in the last 168 hours. BNP (last 3 results) No results for input(s): PROBNP in the last 8760 hours. HbA1C: No results for input(s): HGBA1C in the last 72 hours. CBG: Recent Labs  Lab 01/04/19 1155  GLUCAP 112*   Lipid Profile: No results for input(s): CHOL, HDL, LDLCALC, TRIG, CHOLHDL, LDLDIRECT in the last 72 hours. Thyroid Function Tests: No results for input(s): TSH, T4TOTAL, FREET4, T3FREE, THYROIDAB in the last 72 hours. Anemia Panel: No results for input(s): VITAMINB12, FOLATE, FERRITIN, TIBC, IRON, RETICCTPCT in the last 72 hours. Urine analysis:    Component Value Date/Time   COLORURINE YELLOW 09/15/2018 1104   APPEARANCEUR CLEAR 09/15/2018 1104   LABSPEC 1.011 09/15/2018 1104   PHURINE 7.0 09/15/2018 1104   GLUCOSEU NEGATIVE 09/15/2018 1104   HGBUR NEGATIVE 09/15/2018 1104   BILIRUBINUR NEGATIVE 09/15/2018 1104   KETONESUR NEGATIVE 09/15/2018 1104   PROTEINUR NEGATIVE 09/15/2018 1104   NITRITE NEGATIVE  09/15/2018 1104   LEUKOCYTESUR NEGATIVE 09/15/2018 1104   Sepsis Labs: No results found for this or any previous visit (from the past 240 hour(s)).   Radiological Exams on Admission: Dg Chest Portable 1 View  Result Date: 01/04/2019 CLINICAL DATA:  Altered mental status. EXAM: PORTABLE CHEST 1 VIEW COMPARISON:  None. FINDINGS: Cardiac silhouette is normal in size. No mediastinal or hilar masses or evidence of adenopathy. Linear opacities noted the right lung base consistent with atelectasis or scarring. Lungs otherwise clear. No pleural effusion or pneumothorax. Skeletal structures are demineralized but grossly intact. IMPRESSION: No active disease. Electronically Signed   By: Amie Portlandavid  Ormond Andrade.D.   On: 01/04/2019 13:37   Ct Head Code Stroke Wo Contrast  Result Date: 01/04/2019 CLINICAL DATA:  Code stroke. Altered level of consciousness, unexplained. Last known normal 11 a.Andrade., left-sided weakness. EXAM: CT HEAD WITHOUT CONTRAST TECHNIQUE: Contiguous axial images were obtained from the base of the skull through the vertex without intravenous contrast. COMPARISON:  Head CT 09/15/2018 FINDINGS: Brain: There is no acute intracranial hemorrhage or demarcated cortical infarct. Chronic right basal ganglia lacunar infarct with involvement of the right caudate nucleus. No evidence of intracranial mass. No midline shift or extra-axial fluid collection. Moderate generalized parenchymal atrophy. Ill-defined hypoattenuation of the cerebral white matter is nonspecific, but consistent with chronic small vessel ischemic disease. Vascular: No definite hyperdense vessel. Atherosclerotic calcification of the carotid artery siphons. Skull: No calvarial fracture. Sinuses/Orbits: Imaged orbits demonstrate no acute abnormality. Mild scattered paranasal sinus mucosal thickening. No significant mastoid effusion. ASPECTS (Alberta Stroke Program Early CT Score) - Ganglionic level infarction (caudate, lentiform nuclei, internal  capsule, insula, M1-M3 cortex): 7 - Supraganglionic infarction (M4-M6 cortex): 3 Total score (0-10 with 10 being normal): 10 (excluding chronic right basal ganglia lacunar infarct.) These results were communicated to Dr. Laurence SlateAroor at 12:22 pmon 9/14/2020by text page via the [facility] messaging system. IMPRESSION: No acute intracranial hemorrhage or demarcated cortical infarct. Generalized parenchymal atrophy with chronic small vessel ischemic disease. Chronic lacunar infarct within the right basal ganglia. Electronically Signed   By: Jackey LogeKyle  Golden   On: 01/04/2019 12:23    EKG: Independently reviewed.  Sinus rhythm at 93 bpm with sinus pause and supraventricular bigeminy.  Assessment/Plan SIRS/Sepsis: Acute.  Patient did be febrile up to 100.8 F rectally with WBC elevated at  14.8.  Chest x-ray was otherwise clear and urinalysis was pending.  No lactic acid was initially obtained.  Patient was started on empiric antibiotics of vancomycin and cefepime, and metronidazole -Admit to a progressive bed -Follow-up blood and urinalysis -Trend lactic acid level -Continue empiric antibiotics of vancomycin, cefepime, and metronidazole for now  Transient hypotension: Resolved.  On admission patient noted to have blood pressures maps less than 65.  Bolused 1 L normal saline IV fluids, and then placed on a rate of 50 mL/h with improvement of blood pressures with maps greater than 65. -Check d-dimer -Consider need of additional boluses of fluid for hypotension as needed  Acute encephalopathy: Patient noted to be abruptly altered and unresponsive around 830 this morning.  Covid- 19 screening negative.  Family thinks symptoms secondary to Haldol given yesterday afternoon.  Patient reportedly slept for 24 hours and then improved once medication cleared from system.  Question Medication vs. CVA vs. vs. Seizure vs. Infection.  Family decided against MRI at this time due to altered state and difficulty clearing secretions.  They  do not appear to want to be overly aggressive in patient's care -Aspiration precaution  -Hold sedating medication -Consider discussing checking a repeat CT scan of the brain in a.Andrade. if no signs of improvement   -May benefit from palliative care consult after discussion with family  Hypertrophic cardiomyopathy: Patient with previous history of hokum last EF noted to be 65-70%.  Family denies any previous reports of heart failure. -Strict I&O's and daily weight   Gait disturbance: At baseline patient noted to have difficulty with balance and utilizes a rolling walker to ambulate.  Hypothyroidism: At home patient on 50 mcg of levothyroxine daily -Change levothyroxine po to IV   GERD prophylaxis: Protonix IV DVT prophylaxis: Lovenox Code Status: DNR Family Communication: Discussed plan of care with the patient's daughter present at bedside Disposition Plan: To be determined Consults called: Neurology Admission status: inpatient   Norval Morton MD [facility] Pager [phone]   If 7PM-7AM, please contact night-coverage [email or web address] Password [email or web address]  01/04/2019, 2:20 PM

## 2019-01-04 NOTE — ED Provider Notes (Addendum)
[facility] EMERGENCY DEPARTMENT Provider Note   CSN: [id number] Arrival date & time: 01/04/19  1128  An emergency department physician performed an initial assessment on this suspected stroke patient at 1150.  History   Chief Complaint No chief complaint on file.   HPI Casey Andrade is a 85 y.o. male.     85 y.o male with a PMH of hypothyroidism, hypertension, MV, dementia presents to the ED via EMS status post altered mental status.  Patient comes from collapse nursing center, history was obtained from daughter who reports patient was talking along with conversing with them although confused at baseline and became flaccid and unresponsive.  Patient does have a history of dementia, he is usually conversational, he is currently not on any oxygen at home however arrived via EMS with 10 L of O2 on a nonrebreather.  Level 5 caveat.  The history is provided by the patient.    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   Murmur    noted to have loud murmur   Right bundle branch block    Weakness    chronic weakness without syncope    Patient Active Problem List   Diagnosis Date Noted   Mitral valvular regurgitation 08/17/2016   HOCM (hypertrophic obstructive cardiomyopathy) (HCC) 09/14/2010   RBBB (right bundle branch block) 09/14/2010   Hypothyroidism 09/14/2010   Benign hypertensive heart disease without heart failure 09/14/2010    Past Surgical History:  Procedure Laterality Date   TRANSTHORACIC  ECHOCARDIOGRAM  04/2010   Vigorous LV function with systolic anterior motion of the anterior mitral valve mild dynamic LVOT obstruction with peak gradient 70 mmHg. EF 65-70%. GR 1 DD. Moderate-severe MR        Home Medications    Prior to Admission medications   Medication Sig Start Date End Date Taking? Authorizing Provider  Biotin 2500 MCG CAPS Take 2,500 mcg by mouth daily.    [provider]  Cholecalciferol (VITAMIN D3) 2000 UNITS capsule Take 2,000 Units by mouth. occasionally     [provider]  diclofenac sodium (VOLTAREN) 1 % GEL Apply 2 g topically daily as needed (for pain).     [provider]  furosemide (LASIX) 20 MG tablet Take 20 mg by mouth daily.     [provider]  hydrocortisone 1 % lotion Apply 1 application topically daily. As directed     [provider]  losartan (COZAAR) 100 MG tablet TAKE 1 TABLET (100 MG TOTAL) BY MOUTH DAILY. 05/06/16   Marykay Lex, MD  metoprolol tartrate (LOPRESSOR) 25 MG tablet TAKE 1/2 TABLET BY MOUTH TWICE A DAY Patient taking differently: Take 12.5 mg by mouth 2 (two) times daily.  08/25/17   Marykay Lex, MD  SYNTHROID 50 MCG tablet Take 50 mcg by mouth daily before breakfast. 06/08/15   [provider]  vitamin B-12 (CYANOCOBALAMIN) 250 MCG tablet Take 250 mcg  by mouth daily.      [provider]    Family History Family History  Problem Relation Age of Onset   Diabetes Sister     Social History Social History   Tobacco Use   Smoking status: Never Smoker   Smokeless tobacco: Never Used  Substance Use Topics   Alcohol use: No   Drug use: No     Allergies   Augmentin [amoxicillin-pot clavulanate] and Hydrocodone bitartrate [hydrocodone]   Review of Systems Review of Systems  Unable to perform ROS: Dementia     Physical Exam Updated Vital Signs BP (!) 118/53    Pulse 94    Temp (!) 100.8 F (38.2 C) (Rectal)    Resp 17    Ht 5\' 6"  (1.676 m)     SpO2 96%    BMI 22.60 kg/m   Physical Exam Vitals signs and nursing note reviewed.  Constitutional:      General: He is in acute distress.  HENT:     Head: Normocephalic and atraumatic.     Mouth/Throat:     Mouth: Mucous membranes are dry.  Eyes:     Pupils: Pupils are equal, round, and reactive to light.  Neck:     Musculoskeletal: Normal range of motion and neck supple.  Pulmonary:     Breath sounds: Rhonchi present.     Comments: ON 6 L non rebreather  Abdominal:     General: Abdomen is flat. Bowel sounds are normal.     Tenderness: There is no abdominal tenderness. There is no right CVA tenderness or left CVA tenderness.  Musculoskeletal:        General: No deformity or signs of injury.     Right lower leg: No edema.     Left lower leg: No edema.  Skin:    General: Skin is warm and dry.  Neurological:     Mental Status: He is disoriented.     Motor: Atrophy present.     Comments: Patient does not follow commands, does respond to some painful stimuli.  Appears to be flaccid on left side, upper and lower extremities.        ED Treatments / Results  Labs (all labs ordered are listed, but only abnormal results are displayed) Labs Reviewed  COMPREHENSIVE METABOLIC PANEL - Abnormal; Notable for the following components:      Result Value   CO2 21 (*)    Glucose, Bld 118 (*)    Calcium 8.7 (*)    Total Protein 5.9 (*)    Albumin 3.3 (*)    Total Bilirubin 1.3 (*)    All other components within normal limits  CBC WITH DIFFERENTIAL/PLATELET - Abnormal; Notable for the following components:   WBC 14.8 (*)    Neutro Abs 13.3 (*)    Lymphs Abs 0.3 (*)    Monocytes Absolute 1.1 (*)    Abs Immature Granulocytes 0.08 (*)    All other components within normal limits  CBG MONITORING, ED - Abnormal; Notable for the following components:   Glucose-Capillary 112 (*)    All other components within normal limits  I-STAT CHEM 8, ED - Abnormal; Notable for the following  components:   Creatinine, Ser 0.60 (*)    Glucose, Bld 116 (*)    Calcium, Ion 1.12 (*)    All other components within normal limits  SARS CORONAVIRUS 2 (HOSPITAL ORDER, PERFORMED IN [facility])  ETHANOL  PROTIME-INR  APTT  URINALYSIS, ROUTINE  W REFLEX MICROSCOPIC  RAPID URINE DRUG SCREEN, HOSP PERFORMED    EKG None  Radiology Dg Chest Portable 1 View  Result Date: 01/04/2019 CLINICAL DATA:  Altered mental status. EXAM: PORTABLE CHEST 1 VIEW COMPARISON:  None. FINDINGS: Cardiac silhouette is normal in size. No mediastinal or hilar masses or evidence of adenopathy. Linear opacities noted the right lung base consistent with atelectasis or scarring. Lungs otherwise clear. No pleural effusion or pneumothorax. Skeletal structures are demineralized but grossly intact. IMPRESSION: No active disease. Electronically Signed   By: Amie Portlandavid  Ormond M.D.   On: 01/04/2019 13:37   Ct Head Code Stroke Wo Contrast  Result Date: 01/04/2019 CLINICAL DATA:  Code stroke. Altered level of consciousness, unexplained. Last known normal 11 a.m., left-sided weakness. EXAM: CT HEAD WITHOUT CONTRAST TECHNIQUE: Contiguous axial images were obtained from the base of the skull through the vertex without intravenous contrast. COMPARISON:  Head CT 09/15/2018 FINDINGS: Brain: There is no acute intracranial hemorrhage or demarcated cortical infarct. Chronic right basal ganglia lacunar infarct with involvement of the right caudate nucleus. No evidence of intracranial mass. No midline shift or extra-axial fluid collection. Moderate generalized parenchymal atrophy. Ill-defined hypoattenuation of the cerebral white matter is nonspecific, but consistent with chronic small vessel ischemic disease. Vascular: No definite hyperdense vessel. Atherosclerotic calcification of the carotid artery siphons. Skull: No calvarial fracture. Sinuses/Orbits: Imaged orbits demonstrate no acute abnormality. Mild scattered paranasal sinus  mucosal thickening. No significant mastoid effusion. ASPECTS (Alberta Stroke Program Early CT Score) - Ganglionic level infarction (caudate, lentiform nuclei, internal capsule, insula, M1-M3 cortex): 7 - Supraganglionic infarction (M4-M6 cortex): 3 Total score (0-10 with 10 being normal): 10 (excluding chronic right basal ganglia lacunar infarct.) These results were communicated to Dr. Laurence SlateAroor at 12:22 pmon 9/14/2020by text page via the [facility]. IMPRESSION: No acute intracranial hemorrhage or demarcated cortical infarct. Generalized parenchymal atrophy with chronic small vessel ischemic disease. Chronic lacunar infarct within the right basal ganglia. Electronically Signed   By: Jackey LogeKyle  Golden   On: 01/04/2019 12:23    Procedures .Critical Care Performed by: Claude MangesSoto, Lewellyn Fultz, PA-C Authorized by: Claude MangesSoto, Rashaun Wichert, PA-C   Critical care provider statement:    Critical care time (minutes):  35   Critical care start time:  01/04/2019 12:00 PM   Critical care end time:  01/04/2019 12:30 PM   Critical care time was exclusive of:  Separately billable procedures and treating other patients   Critical care was necessary to treat or prevent imminent or life-threatening deterioration of the following conditions:  CNS failure or compromise   Critical care was time spent personally by me on the following activities:  Blood draw for specimens, development of treatment plan with patient or surrogate, discussions with consultants, evaluation of patient's response to treatment, examination of patient, obtaining history from patient or surrogate, ordering and performing treatments and interventions, ordering and review of laboratory studies, ordering and review of radiographic studies, pulse oximetry, re-evaluation of patient's condition and review of old charts   (including critical care time)  Medications Ordered in ED Medications  sodium chloride 0.9 % bolus 500 mL (has no administration in time range)    metroNIDAZOLE (FLAGYL) IVPB 500 mg (has no administration in time range)  vancomycin (VANCOCIN) IVPB 1000 mg/200 mL premix (has no administration in time range)  acetaminophen (TYLENOL) solution 650 mg (has no administration in time range)     Initial Impression / Assessment and Plan / ED Course  I have reviewed the triage vital signs and  the nursing notes.  Pertinent labs & imaging results that were available during my care of the patient were reviewed by me and considered in my medical decision making (see chart for details).  Clinical Course as of Jan 03 1422  Mon Jan 04, 2019  1420 WBC(!): 14.8 [JS]    Clinical Course User Index [JS] Janeece Fitting, [place]        Patient arrived from nursing facility for altered mental status, daughter at the bedside providing most of the history.  According to her patient was at baseline, conversational although does have a history of dementia when all of a sudden he decompensated, became flaccid. Flat she reports patient received Haldol last night due to being unable to sleep.  Patient was placed on nonrebreather by staff, arrived on 10 L of O2 in the ED satting at 100%.  He appears to have somewhat increased secretions, is not responding to my commands aside from responsive to pain.  Pupils are equal and reactive.   11:40 AM Spoke to [employer] from [facility] who reports patient is alert but confused. Trouble clearing secretions, lungs were clear from nursing staff. Had a bath, was alert and smiling episode lasted 15 minutes. After this was unresponsive, not making eye contact, flaccid. 95% on 6L but currently not on any baseline. Haldol given around 4:30 pm. No falls, no irregular behaviors, has not been complaining of anything.   12:42 PM Per neurology Dr. Aviva Kluver patient is not a TPA candidate or thrombectomy, will continue AMS workup.   CMP without any electrolyte derangement, glucose slightly elevated.  Creatinine level is within normal  limits.  LFTs are unremarkable.  PT/INR within normal limits.  CBC remarkable for a leukocytosis of 14.8, patient is febrile with a rectal temp of 100.8, will give him Tylenol along with begin antibiotics for an unknown source.  X-ray without any consolidation, pneumothorax or pleural effusion.  Will perform in and out cath in order to obtain urine at this time.  CT head did not reveal any acute infarct.  Suspect patient will likely need admission into the hospital for altered mental status and further managment.Covid test pending.   Spoke to hospitalist Dr. Tamala Julian who will admit patient, appreciate his help.    Portions of this note were generated with [employer]. Dictation errors may occur despite best attempts at proofreading.  Final Clinical Impressions(s) / ED Diagnoses   Final diagnoses:  Altered mental status, unspecified altered mental status type    ED Discharge Orders    None       Janeece Fitting, PA-C 01/04/19 1423    Janeece Fitting, PA-C 01/04/19 1457    Noemi Chapel, MD 01/05/19 [phone]

## 2019-01-04 NOTE — Consult Note (Addendum)
Requesting Physician: Dr. Hyacinth Meeker    Chief Complaint: " altered mental status", left side weakness  History obtained from: Patient and Chart     HPI:                                                                                                                                       Casey Andrade is a 85 y.o. male with past medical history significant for advanced dementia presents to the ED for altered mental status and significant lethargy.  Patient was in a assisted living facility and due to agitation received Haldol last night.  This morning, his nurse noticed patient was lethargic during her assessment at 7 AM.  Around 815 AM patient became more responsive and was speaking few words.  Around 8:30 AM, patient became very lethargic and unresponsive.  She checked his blood pressure and his systolics was in the 70s.  EMS was notified and patient brought to [facility] ED. According to EDP, on his initial evaluation patient was withdrawing and moving spontaneously on the right side however the left side appeared to be flaccid and code stroke was activated.  On my assessment, patient was lethargic, nonverbal and not following any commands.  No forced gaze deviation was noted and patient was withdrawing equally on both sides.  Stat CT head was obtained which showed no acute findings.  Patient did not receive IV TPA as he was outside  window.  Patient not a candidate for thrombectomy due to advanced dementia and poor baseline function.  Last known well: unclear, last night vs possibly 8.15 am tPA Given: outside tPA window, unclear if this was a stroke  NIHSS: 27 Baseline MRS 3-4   Past Medical History:  Diagnosis Date  . Dyspnea    increasing  . Essential hypertension   . History of echocardiogram 04/27/2010   EF was in the range of 65-70% / Mitral valve, systolic anterior motion of the anterior mitral chordae               . Hypertension   . Hypertr obst cardiomyop   . Hyponatremia    for  which nephrology is seeing him  . Hypothyroidism   . 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 Murmur    noted to have loud murmur  . Right bundle branch block   . Weakness    chronic weakness without syncope    Past Surgical History:  Procedure Laterality Date  . TRANSTHORACIC ECHOCARDIOGRAM  04/2010   Vigorous LV function with systolic anterior motion of the anterior mitral valve mild dynamic LVOT obstruction with peak gradient 70 mmHg. EF 65-70%. GR 1 DD. Moderate-severe MR    Family History  Problem Relation Age of Onset  . Diabetes Sister    Social History:  reports that he has never smoked.  He has never used smokeless tobacco. He reports that he does not drink alcohol or use drugs.  Allergies:  Allergies  Allergen Reactions  . Augmentin [Amoxicillin-Pot Clavulanate]     Doesn't recall  . Hydrocodone Bitartrate [Hydrocodone]     Doesn't recall    Medications:                                                                                                                        I reviewed home medications   ROS:                                                                                                                                     14 systems reviewed and negative except above    Examination:                                                                                                      General: Appears well-developed  Psych: Affect appropriate to situation Eyes: No scleral injection HENT: No OP obstrucion Head: Normocephalic.  Cardiovascular: Normal rate and regular rhythm.  Respiratory: Effort normal and breath sounds normal to anterior ascultation GI: Soft.  No distension. There is no tenderness.  Skin: WDI    Neurological Examination Mental Status: Patient is obtunded, not following any commands and nonverbal.  Groans to pain.  Cranial Nerves: II: Visual  fields grossly normal,  III,IV, VI: ptosis not present, no forced gaze deviation, pupils equal, round, reactive to light and accommodation VII: No facial asymmetry  XII: midline tongue extension Motor: Right : Upper extremity   2/5    Left:     Upper extremity   2/5  Lower extremity   2/5     Lower extremity   2/5 Tone and bulk:normal tone throughout; no atrophy noted Sensory: unable to assess due to altered mental status  Plantars: Right: downgoing   Left: downgoing Cerebellar: unable to assess  Gait: unable to assess     Lab  Results: Basic Metabolic Panel: No results for input(s): NA, K, CL, CO2, GLUCOSE, BUN, CREATININE, CALCIUM, MG, PHOS in the last 168 hours.  CBC: No results for input(s): WBC, NEUTROABS, HGB, HCT, MCV, PLT in the last 168 hours.  Coagulation Studies: No results for input(s): LABPROT, INR in the last 72 hours.  Imaging: Ct Head Code Stroke Wo Contrast  Result Date: 01/04/2019 CLINICAL DATA:  Code stroke. Altered level of consciousness, unexplained. Last known normal 11 a.m., left-sided weakness. EXAM: CT HEAD WITHOUT CONTRAST TECHNIQUE: Contiguous axial images were obtained from the base of the skull through the vertex without intravenous contrast. COMPARISON:  Head CT 09/15/2018 FINDINGS: Brain: There is no acute intracranial hemorrhage or demarcated cortical infarct. Chronic right basal ganglia lacunar infarct with involvement of the right caudate nucleus. No evidence of intracranial mass. No midline shift or extra-axial fluid collection. Moderate generalized parenchymal atrophy. Ill-defined hypoattenuation of the cerebral white matter is nonspecific, but consistent with chronic small vessel ischemic disease. Vascular: No definite hyperdense vessel. Atherosclerotic calcification of the carotid artery siphons. Skull: No calvarial fracture. Sinuses/Orbits: Imaged orbits demonstrate no acute abnormality. Mild scattered paranasal sinus mucosal thickening. No  significant mastoid effusion. ASPECTS (Alberta Stroke Program Early CT Score) - Ganglionic level infarction (caudate, lentiform nuclei, internal capsule, insula, M1-M3 cortex): 7 - Supraganglionic infarction (M4-M6 cortex): 3 Total score (0-10 with 10 being normal): 10 (excluding chronic right basal ganglia lacunar infarct.) These results were communicated to Dr. Laurence Slate at 12:22 pmon 9/14/2020by text page via the [facility] messaging system. IMPRESSION: No acute intracranial hemorrhage or demarcated cortical infarct. Generalized parenchymal atrophy with chronic small vessel ischemic disease. Chronic lacunar infarct within the right basal ganglia. Electronically Signed   By: Jackey LogeKyle  Golden   On: 01/04/2019 12:23     ASSESSMENT AND PLAN  Casey Andrade is a 85 year old male with dementia who presents to the emergency department in obtunded state.  Differentials include acute ischemic stroke, toxic metabolic encephalopathy, postictal state.  Patient does not meet ECASS III guidelines for TPA at outside 3-hour window ( exclusions include age>80, NIHSS>25).   Recommendations -Check UA chest x-ray, electrolytes -Hold all sedating medications -MRI brain/Repeat CT head if patient does not return to baseline in 24hrs  -Palliative consult     [facility] Pager Number [phone]

## 2019-01-04 NOTE — Progress Notes (Signed)
Patient noted to be awake and asking for ice chips. Continue aspiration precautions and start heart healthy diet as tolerated.

## 2019-01-04 NOTE — ED Notes (Signed)
Pt remains unchanged at this time.  Requires intermittent suctioning of secretions.  VS remain stable.

## 2019-01-04 NOTE — Sepsis Progress Note (Signed)
Notified bedside nurse of need to draw repeat lactic acid. 

## 2019-01-04 NOTE — Code Documentation (Signed)
85yo male arriving to [facility] via [facility] at 1128. Patient from nursing home where he was noted to be lethargic this morning at 0700 by staff. Of note, patient received Haldol last night which is a new medication. Staff reports that he was bathed and assisted up and around 0830 was noted to be at his baseline. At baseline, he ambulates with a walker and is alert and oriented to person but can converse. Around [id number] he was noted to be slumped over and unresponsive and was not managing his secretions. SBP was noted to be in the 110s and decreased to the 70s. EMS was called and patient was transported to the ED where a code stroke was activated for left sided weakness. Stroke team responded to the bedside in CT. NIHSS 27, see documentation for details and code stroke times. Patient stuporous, nonverbal, not following commands with weakness throughout on exam. Patient transported back to the ED. Daughter at bedside reports patient with similar response following initial dose of Haldol. Unclear LKW. No acute stroke treatment at this time. Handoff with ED RN Sharyn Lull.

## 2019-01-04 NOTE — Progress Notes (Signed)
Notified bedside nurse of need to draw lactic acid.  RN already aware.

## 2019-01-05 ENCOUNTER — Encounter (HOSPITAL_COMMUNITY): Payer: Self-pay

## 2019-01-05 DIAGNOSIS — R4182 Altered mental status, unspecified: Secondary | ICD-10-CM

## 2019-01-05 LAB — TSH: TSH: 0.977 u[IU]/mL (ref 0.350–4.500)

## 2019-01-05 LAB — BASIC METABOLIC PANEL
Anion gap: 10 (ref 5–15)
BUN: 19 mg/dL (ref 8–23)
CO2: 21 mmol/L — ABNORMAL LOW (ref 22–32)
Calcium: 8.8 mg/dL — ABNORMAL LOW (ref 8.9–10.3)
Chloride: 106 mmol/L (ref 98–111)
Creatinine, Ser: 0.87 mg/dL (ref 0.61–1.24)
GFR calc Af Amer: >60 mL/min (ref >60)
GFR calc non Af Amer: >60 mL/min (ref >60)
Glucose, Bld: 103 mg/dL — ABNORMAL HIGH (ref 70–99)
Potassium: 3.7 mmol/L (ref 3.5–5.1)
Sodium: 137 mmol/L (ref 135–145)

## 2019-01-05 LAB — CBC
HCT: 44.2 % (ref 39.0–52.0)
Hemoglobin: 14.4 g/dL (ref 13.0–17.0)
MCH: 32.7 pg (ref 26.0–34.0)
MCHC: 32.6 g/dL (ref 30.0–36.0)
MCV: 100.2 fL — ABNORMAL HIGH (ref 80.0–100.0)
Platelets: 191 10*3/uL (ref 150–400)
RBC: 4.41 MIL/uL (ref 4.22–5.81)
RDW: 12.8 % (ref 11.5–15.5)
WBC: 7.2 10*3/uL (ref 4.0–10.5)
nRBC: 0 % (ref 0.0–0.2)

## 2019-01-05 LAB — MRSA PCR SCREENING: MRSA by PCR: NEGATIVE

## 2019-01-05 MED ORDER — HYDRALAZINE HCL 20 MG/ML IJ SOLN
10.0000 mg | Freq: Four times a day (QID) | INTRAMUSCULAR | Status: DC | PRN
  Administered 2019-01-05 – 2019-01-09 (×3): 10 mg via INTRAVENOUS
  Filled 2019-01-05 (×3): qty 1

## 2019-01-05 MED ORDER — METOPROLOL TARTRATE 12.5 MG HALF TABLET: 12.5000 mg | ORAL_TABLET | Freq: Two times a day (BID) | ORAL | Status: DC

## 2019-01-05 MED ORDER — FINASTERIDE 5 MG PO TABS
5.0000 mg | ORAL_TABLET | Freq: Every day | ORAL | Status: DC
  Administered 2019-01-07 – 2019-01-11 (×5): 5 mg via ORAL
  Filled 2019-01-05 (×6): qty 1

## 2019-01-05 MED ORDER — LOSARTAN POTASSIUM 50 MG PO TABS: 50.0000 mg | ORAL_TABLET | Freq: Every day | ORAL | Status: DC

## 2019-01-05 MED ORDER — LEVOTHYROXINE SODIUM 50 MCG PO TABS: 50.0000 ug | ORAL_TABLET | Freq: Every day | ORAL | Status: DC

## 2019-01-05 NOTE — ED Notes (Addendum)
Attempted to call back family x 2. Unidentified message reached. No message left as unidentified.

## 2019-01-05 NOTE — Progress Notes (Signed)
RT attempted to NT suction pt but it would coil in back of his mouth each time. Small amount of yellow thick frothy secretions suctioned out of the back of his throat. Pt unable to clear secretions at this time

## 2019-01-05 NOTE — ED Notes (Signed)
Neuro attempted but pt returns to sleep and does not complete

## 2019-01-05 NOTE — ED Notes (Signed)
Pt daughter Fletcher Anon [phone]

## 2019-01-05 NOTE — ED Notes (Addendum)
Condom cath dislodged and pt noted as wet with urine. Pt cleaned and linen changed with new condom cath applied. Pt tolerates well. Rouses briefly then back to sleep.

## 2019-01-05 NOTE — ED Notes (Addendum)
Sips attempted and pt is unable to control fluids. Coughs and requires suctioning, gurgling respirations noted. will inform provider.

## 2019-01-05 NOTE — Progress Notes (Signed)
SLP Cancellation Note  Patient Details Name: TRE SANKER MRN: [id number] DOB: 1919/06/15   Cancelled treatment:        Attempted to see pt for swallowing evaluation.  Spoke with RN. Pt is too lethargic to participate in PO trials at this time. SLP will reattempt as schedule permits when pt is medically appropriate.                                                                                                Celedonio Savage, [place], [place] Office: [phone] 01/05/2019, 1:00 PM

## 2019-01-05 NOTE — Progress Notes (Signed)
Casey Andrade [id number] Admission Data: 01/05/2019 4:28 PM Attending Provider: Norval Morton, MD  [name], [name], MD Consults/ Treatment Team:   Casey Andrade is a 85 y.o. male patient admitted from ED awake, alert  & orientated  X 3,  DNR, VSS - Blood pressure (!) 158/104, pulse (!) 101, temperature 98.3 F (36.8 C), temperature source Axillary, resp. rate (!) 22, height 5\' 6"  (1.676 m), SpO2 100 %., O2    4 L nasal cannular, no c/o shortness of breath, no c/o chest pain, no distress noted. Tele # [id number] placed and pt is currently running:normal sinus rhythm.   IV site WDL:  Antecubital, upper arm, left, condition patent and no redness with a transparent dsg that's clean dry and intact.  Allergies:   Allergies  Allergen Reactions  . Augmentin [Amoxicillin-Pot Clavulanate]     Doesn't recall  . Hydrocodone Bitartrate [Hydrocodone]     Doesn't recall  . Neosporin [Bacitracin-Polymyxin B] Other (See Comments)    unknown     Past Medical History:  Diagnosis Date  . Dyspnea    increasing  . Essential hypertension   . History of echocardiogram 04/27/2010   EF was in the range of 65-70% / Mitral valve, systolic anterior motion of the anterior mitral chordae               . Hypertension   . Hypertr obst cardiomyop   . Hyponatremia    for which nephrology is seeing him  . Hypothyroidism   . 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 Murmur    noted to have loud murmur  . Right bundle branch block   . Weakness    chronic weakness without syncope    Pt orientation to unit, room and routine. Information packet given to patient/family and safety video watched.  Admission INP armband ID verified with patient/family, and in place. SR up x 2, fall risk assessment complete with Patient and family verbalizing understanding of risks associated with falls. Pt verbalizes an understanding of how to use the  call bell and to call for help before getting out of bed.  Skin, clean-dry- intact without evidence of bruising, or skin tears.   No evidence of skin break down noted on exam.  BP elevated. PRN hydralazine administered.   Will cont to monitor and assist as needed.  Walker Shadow, RN 01/05/2019 4:28 PM

## 2019-01-05 NOTE — ED Notes (Signed)
Pt awake from sleep with congested cough. Required suctioning. HOB elevated > 45 degrees

## 2019-01-05 NOTE — ED Notes (Addendum)
Fluids noted as dripping from saline bag and fluids in cefepime bag overfilled by double. Pharmacy states to remove and replace bagPt declines breakfast. Suctioned of thick secretions in back of throat.

## 2019-01-05 NOTE — ED Notes (Signed)
SDU  Breakfast ordered  

## 2019-01-05 NOTE — Progress Notes (Signed)
PROGRESS NOTE    Casey ShellRobert M Hurtubise  ZOX:[id number] DOB: 05-01-1919 DOA: 01/04/2019 PCP: Catha GosselinLittle, Kevin, MD   Brief Narrative:  Casey Andrade is a 85 y.o. male with medical history significant of hypertension, hypertrophic cardiomyopathy last EF 65 to 70%, dementia, hypothyroidism, hyponatremia, and gait disturbance; who was brought into the ED after being found acutely altered.    Reportedly, he was Previously noted to be lethargic but arousable and speaking 15 minutes prior.  Upon EMS arrival he was initially placed on a nonrebreather and patient appeared flaccid on the left side with some muscle tone reported on the right.  Initially suspected stroke versus aspiration.  Per daughter, patient has had chronic productive cough, but had increased sputum production although reported to be clear in the last week or so.  Otherwise patient had been noted to be in fairly decent health for his age and utilized a rolling walker to ambulate as he was high falls risk.  Reportedly Patient was given Haldol afternoon around 4:30 p.m a day prior.according to daughter, he was previously given Haldol he slept for over 24 hours thereafter.  On admission into the emergency department patient was found to be febrile up to 100.8 F rectally, respirations 16-26, blood pressure noted as 97/45, and O2 saturation maintained currently on 1 L nasal cannula oxygen. CT scan of the brain was negative for any acute abnormalities, and code stroke had been canceled after patient was evaluated by neurology.  Chest x-ray showed no acute abnormalities. Labs significant for WBC 14.8.  COVID-19 negative.  No initial lactic acid was obtained.  Patient was started on empiric antibiotics of vancomycin, metronidazole, and cefepime.  Assessment & Plan:   Principal Problem:   Sepsis (HCC) Active Problems:   HOCM (hypertrophic obstructive cardiomyopathy) (HCC)   Hypothyroidism   Transient hypotension   Encephalopathy  Acute  encephalopathy: Likely multifactorial, could be due to acute infection, aspiration pneumonia versus Haldol.  Currently he is still significantly lethargic.  Unable to talk to me.  Daughter at the bedside.  Looks comfortable.  Will treat underlying cause.  Possible aspiration pneumonia: Patient was started on cefepime, Flagyl and vancomycin.  Also chest x-ray suggests no active disease however radiological findings could lag behind clinical findings and he had fever of 100.8 and he is so lethargic that there is high possibility of aspiration.  I will continue cefepime but since I do not suspect MRSA so we will discontinue vancomycin.  He has remained afebrile.  Dehydration/decreased urine output: We will increase IV fluid to 100 cc/h.  Monitor I's and O's strictly.  Hypothyroidism: Continue Synthroid.  History of hypertension with transient hypotension: Pressure much better and in fact slightly elevated.  Will resume all home medications however since he is too lethargic and may not be able to tolerate p.o. medications I will also place him on IV hydralazine as needed.    DVT prophylaxis: Lovenox Code Status: DNR Family Communication: Plan of care discussed with the daughter at the bedside. Disposition Plan: TBD.  Hopefully discharge in next 1 to 2 days.  Consultants:   None  Procedures:   None  Antimicrobials:   Cefepime   Subjective: Patient seen and examined while he was still in the ER waiting for the bed on the floor.  Daughter at the bedside.  Patient too lethargic to hold any conversation.  Looks comfortable.  Objective: Vitals:   01/05/19 1015 01/05/19 1030 01/05/19 1100 01/05/19 1130  BP:  (!) 138/112 (!) 163/64 Marland Kitchen(!)  162/65  Pulse:   77 83  Resp: 20 (!) 23 12 12   Temp:      TempSrc:      SpO2: 96%  98% 99%  Height:        Intake/Output Summary (Last 24 hours) at 01/05/2019 1301 Last data filed at 01/05/2019 8756 Gross per 24 hour  Intake 1400 ml  Output 200 ml    Net 1200 ml   Filed Weights    Examination:  General exam: Appears calm and comfortable but very lethargic. Respiratory system: Minimal rhonchi at the bases bilaterally. Respiratory effort normal. Cardiovascular system: S1 & S2 heard, RRR. No JVD, murmurs, rubs, gallops or clicks. No pedal edema. Gastrointestinal system: Abdomen is nondistended, soft and nontender. No organomegaly or masses felt. Normal bowel sounds heard. Central nervous system: Lethargic. No focal neurological deficits. Skin: No rashes, lesions or ulcers Psychiatry: Unable to assess since patient is not in a position to communicate.   Data Reviewed: I have personally reviewed following labs and imaging studies  CBC: Recent Labs  Lab 01/04/19 1300 01/04/19 1308 01/05/19 0646  WBC 14.8*  --  7.2  NEUTROABS 13.3*  --   --   HGB 14.3 14.3 14.4  HCT 42.9 42.0 44.2  MCV 98.6  --  100.2*  PLT 218  --  191   Basic Metabolic Panel: Recent Labs  Lab 01/04/19 1300 01/04/19 1308 01/05/19 0646  NA 136 137 137  K 3.8 4.1 3.7  CL 104 101 106  CO2 21*  --  21*  GLUCOSE 118* 116* 103*  BUN 16 21 19   CREATININE 0.75 0.60* 0.87  CALCIUM 8.7*  --  8.8*   GFR: CrCl cannot be calculated (Unknown ideal weight.). Liver Function Tests: Recent Labs  Lab 01/04/19 1300  AST 23  ALT 20  ALKPHOS 76  BILITOT 1.3*  PROT 5.9*  ALBUMIN 3.3*   No results for input(s): LIPASE, AMYLASE in the last 168 hours. No results for input(s): AMMONIA in the last 168 hours. Coagulation Profile: Recent Labs  Lab 01/04/19 1300  INR 1.0   Cardiac Enzymes: No results for input(s): CKTOTAL, CKMB, CKMBINDEX, TROPONINI in the last 168 hours. BNP (last 3 results) No results for input(s): PROBNP in the last 8760 hours. HbA1C: No results for input(s): HGBA1C in the last 72 hours. CBG: Recent Labs  Lab 01/04/19 1155  GLUCAP 112*   Lipid Profile: No results for input(s): CHOL, HDL, LDLCALC, TRIG, CHOLHDL, LDLDIRECT in the  last 72 hours. Thyroid Function Tests: Recent Labs    01/05/19 0646  TSH 0.977   Anemia Panel: No results for input(s): VITAMINB12, FOLATE, FERRITIN, TIBC, IRON, RETICCTPCT in the last 72 hours. Sepsis Labs: Recent Labs  Lab 01/04/19 1327 01/04/19 1755 01/04/19 2257  PROCALCITON <0.10  --   --   LATICACIDVEN  --  2.1* 1.4    Recent Results (from the past 240 hour(s))  SARS Coronavirus 2 [facility] order, Performed in [facility] hospital lab) Nasopharyngeal Nasopharyngeal Swab     Status: None   Collection Time: 01/04/19  1:50 PM   Specimen: Nasopharyngeal Swab  Result Value Ref Range Status   SARS Coronavirus 2 NEGATIVE NEGATIVE Final    Comment: (NOTE) If result is NEGATIVE SARS-CoV-2 target nucleic acids are NOT DETECTED. The SARS-CoV-2 RNA is generally detectable in upper and lower  respiratory specimens during the acute phase of infection. The lowest  concentration of SARS-CoV-2 viral copies this assay can detect is 250  copies /  mL. A negative result does not preclude SARS-CoV-2 infection  and should not be used as the sole basis for treatment or other  patient management decisions.  A negative result may occur with  improper specimen collection / handling, submission of specimen other  than nasopharyngeal swab, presence of viral mutation(s) within the  areas targeted by this assay, and inadequate number of viral copies  (<250 copies / mL). A negative result must be combined with clinical  observations, patient history, and epidemiological information. If result is POSITIVE SARS-CoV-2 target nucleic acids are DETECTED. The SARS-CoV-2 RNA is generally detectable in upper and lower  respiratory specimens dur ing the acute phase of infection.  Positive  results are indicative of active infection with SARS-CoV-2.  Clinical  correlation with patient history and other diagnostic information is  necessary to determine patient infection status.  Positive results do  not  rule out bacterial infection or co-infection with other viruses. If result is PRESUMPTIVE POSTIVE SARS-CoV-2 nucleic acids MAY BE PRESENT.   A presumptive positive result was obtained on the submitted specimen  and confirmed on repeat testing.  While 2019 novel coronavirus  (SARS-CoV-2) nucleic acids may be present in the submitted sample  additional confirmatory testing may be necessary for epidemiological  and / or clinical management purposes  to differentiate between  SARS-CoV-2 and other Sarbecovirus currently known to infect humans.  If clinically indicated additional testing with an alternate test  methodology [phone]([id number]) is advised. The SARS-CoV-2 RNA is generally  detectable in upper and lower respiratory sp ecimens during the acute  phase of infection. The expected result is Negative. Fact Sheet for Patients:  BoilerBrush.com.cyhttps://www.fda.gov/media/136312/download Fact Sheet for Healthcare Providers: https://pope.com/https://www.fda.gov/media/136313/download This test is not yet approved or cleared by the [place] FDA and has been authorized for detection and/or diagnosis of SARS-CoV-2 by FDA under an Emergency Use Authorization (EUA).  This EUA will remain in effect (meaning this test can be used) for the duration of the COVID-19 declaration under Section 564(b)(1) of the Act, 21 U.S.C. section 360bbb-3(b)(1), unless the authorization is terminated or revoked sooner. Performed at [facility], [address] [place], [place] [address]   Culture, blood (x 2)     Status: None (Preliminary result)   Collection Time: 01/04/19  5:55 PM   Specimen: BLOOD  Result Value Ref Range Status   Specimen Description BLOOD RIGHT ANTECUBITAL  Final   Special Requests   Final    BOTTLES DRAWN AEROBIC AND ANAEROBIC Blood Culture results may not be optimal due to an inadequate volume of blood received in culture bottles   Culture   Final    NO GROWTH < 24 HOURS Performed at [facility], [address] [place], [place] [address]    Report Status PENDING  Incomplete  Culture, blood (x 2)     Status: None (Preliminary result)   Collection Time: 01/04/19 10:27 PM   Specimen: BLOOD  Result Value Ref Range Status   Specimen Description BLOOD RIGHT ANTECUBITAL  Final   Special Requests   Final    BOTTLES DRAWN AEROBIC AND ANAEROBIC Blood Culture adequate volume   Culture   Final    NO GROWTH < 12 HOURS Performed at [facility], [address] [place], [place] [address]    Report Status PENDING  Incomplete      Radiology Studies: Dg Chest Portable 1 View  Result Date: 01/04/2019 CLINICAL DATA:  Altered mental status. EXAM: PORTABLE CHEST  1 VIEW COMPARISON:  None. FINDINGS: Cardiac silhouette is normal in size. No mediastinal or hilar masses or evidence of adenopathy. Linear opacities noted the right lung base consistent with atelectasis or scarring. Lungs otherwise clear. No pleural effusion or pneumothorax. Skeletal structures are demineralized but grossly intact. IMPRESSION: No active disease. Electronically Signed   By: Lajean Manes M.D.   On: 01/04/2019 13:37   Ct Head Code Stroke Wo Contrast  Result Date: 01/04/2019 CLINICAL DATA:  Code stroke. Altered level of consciousness, unexplained. Last known normal 11 a.m., left-sided weakness. EXAM: CT HEAD WITHOUT CONTRAST TECHNIQUE: Contiguous axial images were obtained from the base of the skull through the vertex without intravenous contrast. COMPARISON:  Head CT 09/15/2018 FINDINGS: Brain: There is no acute intracranial hemorrhage or demarcated cortical infarct. Chronic right basal ganglia lacunar infarct with involvement of the right caudate nucleus. No evidence of intracranial mass. No midline shift or extra-axial fluid collection. Moderate generalized parenchymal atrophy. Ill-defined hypoattenuation of the cerebral white matter is nonspecific, but consistent with chronic small vessel ischemic disease. Vascular: No definite  hyperdense vessel. Atherosclerotic calcification of the carotid artery siphons. Skull: No calvarial fracture. Sinuses/Orbits: Imaged orbits demonstrate no acute abnormality. Mild scattered paranasal sinus mucosal thickening. No significant mastoid effusion. ASPECTS ([place] Stroke Program Early CT Score) - Ganglionic level infarction (caudate, lentiform nuclei, internal capsule, insula, M1-M3 cortex): 7 - Supraganglionic infarction (M4-M6 cortex): 3 Total score (0-10 with 10 being normal): 10 (excluding chronic right basal ganglia lacunar infarct.) These results were communicated to Dr. Lorraine Lax at 12:22 pmon 9/14/2020by text page via the [facility] messaging system. IMPRESSION: No acute intracranial hemorrhage or demarcated cortical infarct. Generalized parenchymal atrophy with chronic small vessel ischemic disease. Chronic lacunar infarct within the right basal ganglia. Electronically Signed   By: Kellie Simmering   On: 01/04/2019 12:23    Scheduled Meds:  acetaminophen (TYLENOL) oral liquid 160 mg/5 mL  650 mg Oral Once   enoxaparin (LOVENOX) injection  40 mg Subcutaneous Q24H   levothyroxine  25 mcg Intravenous Daily   mouth rinse  15 mL Mouth Rinse BID   pantoprazole (PROTONIX) IV  40 mg Intravenous Q24H   sodium chloride flush  3 mL Intravenous Q12H   Continuous Infusions:  sodium chloride 50 mL/hr at 01/04/19 1947   ceFEPime (MAXIPIME) IV Stopped (01/05/19 6834)   vancomycin       LOS: 1 day   Time spent: 35 minutes   Darliss Cheney, MD [facility] Pager [phone]  If 7PM-7AM, please contact night-coverage [email or web address] Password [email or web address] 01/05/2019, 1:01 PM

## 2019-01-05 NOTE — ED Notes (Signed)
Pt transferred onto hospital bed at this time for comfort

## 2019-01-05 NOTE — Progress Notes (Signed)
NEUROLOGY PROGRESS NOTE  Subjective: Patient is more alert today, able to follow commands  Exam: Vitals:   01/05/19 1600 01/05/19 1650  BP: (!) 158/104 (!) 121/52  Pulse: (!) 101 87  Resp: (!) 22 13  Temp: 98.3 F (36.8 C)   SpO2: 100% 98%    Physical Exam   HEENT-  Normocephalic, no lesions, without obvious abnormality.  Normal external eye and conjunctiva.   Extremities- Warm, dry and intact Musculoskeletal-no joint tenderness, deformity or swelling Skin-warm and dry, no hyperpigmentation, vitiligo, or suspicious lesions    Neuro:  Mental Status: Alert, able to recognize his daughter and follow simple commands Cranial Nerves: II:  Visual fields grossly normal, was able to count fingers III,IV, VI: ptosis not present, extra-ocular motions intact bilaterally pupils equal, round, reactive to light and accommodation V,VII: smile symmetric, facial light touch sensation normal bilaterally VIII: hearing normal bilaterally Motor: Able to move all extremities antigravity approximately 3 inches above the bed.  Patient does have a tremor but this is baseline.     Medications:  Scheduled: . acetaminophen (TYLENOL) oral liquid 160 mg/5 mL  650 mg Oral Once  . enoxaparin (LOVENOX) injection  40 mg Subcutaneous Q24H  . finasteride  5 mg Oral QHS  . levothyroxine  25 mcg Intravenous Daily  . losartan  50 mg Oral Daily  . mouth rinse  15 mL Mouth Rinse BID  . metoprolol tartrate  12.5 mg Oral BID  . pantoprazole (PROTONIX) IV  40 mg Intravenous Q24H  . sodium chloride flush  3 mL Intravenous Q12H   Continuous: . sodium chloride 50 mL/hr at 01/05/19 1614  . ceFEPime (MAXIPIME) IV Stopped (01/05/19 1950)    Pertinent Labs/Diagnostics: Negative urinalysis  Dg Chest Portable 1 View  Result Date: 01/04/2019 CLINICAL DATA:  Altered mental status. EXAM: PORTABLE CHEST 1 VIEW COMPARISON:  None. FINDINGS: Cardiac silhouette is normal in size. No mediastinal or hilar masses or  evidence of adenopathy. Linear opacities noted the right lung base consistent with atelectasis or scarring. Lungs otherwise clear. No pleural effusion or pneumothorax. Skeletal structures are demineralized but grossly intact. IMPRESSION: No active disease. Electronically Signed   By: Lajean Manes M.D.   On: 01/04/2019 13:37   Ct Head Code Stroke Wo Contrast  Result Date: 01/04/2019 CLINICAL DATA:  Code stroke. Altered level of consciousness, unexplained. Last known normal 11 a.m., left-sided weakness. EXAM: CT HEAD WITHOUT CONTRAST TECHNIQUE: Contiguous axial images were obtained from the base of the skull through the vertex without intravenous contrast. COMPARISON:  Head CT 09/15/2018 FINDINGS: Brain: There is no acute intracranial hemorrhage or demarcated cortical infarct. Chronic right basal ganglia lacunar infarct with involvement of the right caudate nucleus. No evidence of intracranial mass. No midline shift or extra-axial fluid collection. Moderate generalized parenchymal atrophy. Ill-defined hypoattenuation of the cerebral white matter is nonspecific, but consistent with chronic small vessel ischemic disease. Vascular: No definite hyperdense vessel. Atherosclerotic calcification of the carotid artery siphons. Skull: No calvarial fracture. Sinuses/Orbits: Imaged orbits demonstrate no acute abnormality. Mild scattered paranasal sinus mucosal thickening. No significant mastoid effusion. ASPECTS ([place] Stroke Program Early CT Score) - Ganglionic level infarction (caudate, lentiform nuclei, internal capsule, insula, M1-M3 cortex): 7 - Supraganglionic infarction (M4-M6 cortex): 3 Total score (0-10 with 10 being normal): 10 (excluding chronic right basal ganglia lacunar infarct.) These results were communicated to Dr. Lorraine Lax at 12:22 pmon 9/14/2020by text page via the [facility] messaging system. IMPRESSION: No acute intracranial hemorrhage or demarcated cortical infarct. Generalized parenchymal atrophy  with  chronic small vessel ischemic disease. Chronic lacunar infarct within the right basal ganglia. Electronically Signed   By: Jackey LogeKyle  Golden   On: 01/04/2019 12:23     Felicie Mornavid Nyiah Pianka PA-C [facility] [phone]   Assessment: 85 year old male with dementia who presented to the emergency department in an obtunded state.  CT head was negative and family has stated they do not want to have an MRI.  Patient's left-sided weakness has improved.  At this point time he is much improved in mentation and is able to follow commands.  Recommendations: -Repeat CT head tomorrow -Palliative consult-discussed with daughter about how it is good to have a palliative consult so everybody is on the same line if something were to happen.    01/05/2019, 6:12 PM

## 2019-01-05 NOTE — ED Notes (Signed)
ED TO INPATIENT HANDOFF REPORT  ED Nurse Name and Phone #: Quitman LivingsMilli [phone]   S Name/Age/Gender Casey Andrade 85 y.o. male Room/Bed: 035C/035C  Code Status   Code Status: DNR  Home/SNF/Other Nursing Home Patient oriented to: self Is this baseline? No   Triage Complete: Triage complete  Chief Complaint ams  Triage Note No notes on file   Allergies Allergies  Allergen Reactions  . Augmentin [Amoxicillin-Pot Clavulanate]     Doesn't recall  . Hydrocodone Bitartrate [Hydrocodone]     Doesn't recall  . Neosporin [Bacitracin-Polymyxin B] Other (See Comments)    unknown    Level of Care/Admitting Diagnosis ED Disposition    ED Disposition Condition Comment   Admit  [facility] [[id number]]  Level of Care: Progressive [102]  Covid Evaluation: Confirmed COVID Negative  Diagnosis: Sepsis [facility](HCC) [[id number]]) [[id number]]  Admitting Physician: Clydie BraunSMITH, RONDELL A [[id number]]  Attending Physician: Clydie BraunSMITH, RONDELL A [[id number]]  Estimated length of stay: past midnight tomorrow  Certification:: I certify this patient will need inpatient services for at least 2 midnights  PT Class (Do Not Modify): Inpatient [101]  PT Acc Code (Do Not Modify): Private [1]       B Medical/Surgery History Past Medical History:  Diagnosis Date  . Dyspnea    increasing  . Essential hypertension   . History of echocardiogram 04/27/2010   EF was in the range of 65-70% / Mitral valve, systolic anterior motion of the anterior mitral chordae               . Hypertension   . Hypertr obst cardiomyop   . Hyponatremia    for which nephrology is seeing him  . Hypothyroidism   . 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 Murmur    noted to have loud murmur  . Right bundle branch block   . Weakness    chronic weakness without syncope   Past Surgical History:  Procedure Laterality Date  . TRANSTHORACIC  ECHOCARDIOGRAM  04/2010   Vigorous LV function with systolic anterior motion of the anterior mitral valve mild dynamic LVOT obstruction with peak gradient 70 mmHg. EF 65-70%. GR 1 DD. Moderate-severe MR     A IV Location/Drains/Wounds Patient Lines/Drains/Airways Status   Active Line/Drains/Airways    Name:   Placement date:   Placement time:   Site:   Days:   Peripheral IV 01/04/19 Anterior;Distal;Left;Upper Arm   01/04/19    1105    Arm   1   Peripheral IV 01/04/19 Left Antecubital   01/04/19    1756    Antecubital   1   External Urinary Catheter   01/04/19    1243    -   1          Intake/Output Last 24 hours  Intake/Output Summary (Last 24 hours) at 01/05/2019 1521 Last data filed at 01/05/2019 [id number] Gross per 24 hour  Intake 900 ml  Output 200 ml  Net 700 ml    Labs/Imaging Results for orders placed or performed during the hospital encounter of 01/04/19 (from the past 48 hour(s))  POC CBG, ED     Status: Abnormal   Collection Time: 01/04/19 11:55 AM  Result Value Ref Range   Glucose-Capillary 112 (H) 70 - 99 mg/dL  Comprehensive metabolic panel     Status: Abnormal   Collection Time: 01/04/19  1:00 PM  Result Value Ref  Range   Sodium 136 135 - 145 mmol/L   Potassium 3.8 3.5 - 5.1 mmol/L   Chloride 104 98 - 111 mmol/L   CO2 21 (L) 22 - 32 mmol/L   Glucose, Bld 118 (H) 70 - 99 mg/dL   BUN 16 8 - 23 mg/dL   Creatinine, Ser 3.53 0.61 - 1.24 mg/dL   Calcium 8.7 (L) 8.9 - 10.3 mg/dL   Total Protein 5.9 (L) 6.5 - 8.1 g/dL   Albumin 3.3 (L) 3.5 - 5.0 g/dL   AST 23 15 - 41 U/L   ALT 20 0 - 44 U/L   Alkaline Phosphatase 76 38 - 126 U/L   Total Bilirubin 1.3 (H) 0.3 - 1.2 mg/dL   GFR calc non Af Amer >60 >60 mL/min   GFR calc Af Amer >60 >60 mL/min   Anion gap 11 5 - 15    Comment: Performed at [facility], [address] [place], [place] [address]  CBC with Differential     Status: Abnormal   Collection Time: 01/04/19  1:00 PM  Result Value Ref Range   WBC  14.8 (H) 4.0 - 10.5 K/uL   RBC 4.35 4.22 - 5.81 MIL/uL   Hemoglobin 14.3 13.0 - 17.0 g/dL   HCT 15.4 00.8 - 67.6 %   MCV 98.6 80.0 - 100.0 fL   MCH 32.9 26.0 - 34.0 pg   MCHC 33.3 30.0 - 36.0 g/dL   RDW 19.5 09.3 - 26.7 %   Platelets 218 150 - 400 K/uL   nRBC 0.0 0.0 - 0.2 %   Neutrophils Relative % 90 %   Neutro Abs 13.3 (H) 1.7 - 7.7 K/uL   Lymphocytes Relative 2 %   Lymphs Abs 0.3 (L) 0.7 - 4.0 K/uL   Monocytes Relative 7 %   Monocytes Absolute 1.1 (H) 0.1 - 1.0 K/uL   Eosinophils Relative 0 %   Eosinophils Absolute 0.0 0.0 - 0.5 K/uL   Basophils Relative 0 %   Basophils Absolute 0.1 0.0 - 0.1 K/uL   Immature Granulocytes 1 %   Abs Immature Granulocytes 0.08 (H) 0.00 - 0.07 K/uL    Comment: Performed at [facility], [address] [place], [place] [address]  Ethanol     Status: None   Collection Time: 01/04/19  1:00 PM  Result Value Ref Range   Alcohol, Ethyl (B) <10 <10 mg/dL    Comment: (NOTE) Lowest detectable limit for serum alcohol is 10 mg/dL. For medical purposes only. Performed at [facility], [address] [place], [place] [address]   Protime-INR     Status: None   Collection Time: 01/04/19  1:00 PM  Result Value Ref Range   Prothrombin Time 13.1 11.4 - 15.2 seconds   INR 1.0 0.8 - 1.2    Comment: (NOTE) INR goal varies based on device and disease states. Performed at [facility], [address], [place], [place] [address]   APTT     Status: None   Collection Time: 01/04/19  1:00 PM  Result Value Ref Range   aPTT 36 24 - 36 seconds    Comment: Performed at [facility], [address], [place], [place] [address]  I-stat chem 8, ED     Status: Abnormal   Collection Time: 01/04/19  1:08 PM  Result Value Ref Range   Sodium 137 135 - 145 mmol/L   Potassium 4.1 3.5 - 5.1 mmol/L  Chloride 101 98 - 111 mmol/L   BUN 21 8 - 23 mg/dL   Creatinine, Ser 1.61 (L) 0.61 - 1.24 mg/dL   Glucose, Bld 096 (H) 70 - 99 mg/dL   Calcium,  Ion 0.45 (L) 1.15 - 1.40 mmol/L   TCO2 26 22 - 32 mmol/L   Hemoglobin 14.3 13.0 - 17.0 g/dL   HCT 40.9 81.1 - 91.4 %  D-dimer, quantitative (not at [facility]     Status: Abnormal   Collection Time: 01/04/19  1:16 PM  Result Value Ref Range   D-Dimer, Quant 0.85 (H) 0.00 - 0.50 ug/mL-FEU    Comment: (NOTE) At the manufacturer cut-off of 0.50 ug/mL FEU, this assay has been documented to exclude PE with a sensitivity and negative predictive value of 97 to 99%.  At this time, this assay has not been approved by the FDA to exclude DVT/VTE. Results should be correlated with clinical presentation. Performed at [facility], [address] [place], [place] [address]   Procalcitonin     Status: None   Collection Time: 01/04/19  1:27 PM  Result Value Ref Range   Procalcitonin <0.10 ng/mL    Comment:        Interpretation: PCT (Procalcitonin) <= 0.5 ng/mL: Systemic infection (sepsis) is not likely. Local bacterial infection is possible. (NOTE)       Sepsis PCT Algorithm           Lower Respiratory Tract                                      Infection PCT Algorithm    ----------------------------     ----------------------------         PCT < 0.25 ng/mL                PCT < 0.10 ng/mL         Strongly encourage             Strongly discourage   discontinuation of antibiotics    initiation of antibiotics    ----------------------------     -----------------------------       PCT 0.25 - 0.50 ng/mL            PCT 0.10 - 0.25 ng/mL               OR       >80% decrease in PCT            Discourage initiation of                                            antibiotics      Encourage discontinuation           of antibiotics    ----------------------------     -----------------------------         PCT >= 0.50 ng/mL              PCT 0.26 - 0.50 ng/mL               AND        <80% decrease in PCT             Encourage initiation of  antibiotics        Encourage continuation           of antibiotics    ----------------------------     -----------------------------        PCT >= 0.50 ng/mL                  PCT > 0.50 ng/mL               AND         increase in PCT                  Strongly encourage                                      initiation of antibiotics    Strongly encourage escalation           of antibiotics                                     -----------------------------                                           PCT <= 0.25 ng/mL                                                 OR                                        > 80% decrease in PCT                                     Discontinue / Do not initiate                                             antibiotics Performed at [facility], [address] [place], [place] [address]   SARS Coronavirus 2 [facility] order, Performed in [facility] hospital lab) Nasopharyngeal Nasopharyngeal Swab     Status: None   Collection Time: 01/04/19  1:50 PM   Specimen: Nasopharyngeal Swab  Result Value Ref Range   SARS Coronavirus 2 NEGATIVE NEGATIVE    Comment: (NOTE) If result is NEGATIVE SARS-CoV-2 target nucleic acids are NOT DETECTED. The SARS-CoV-2 RNA is generally detectable in upper and lower  respiratory specimens during the acute phase of infection. The lowest  concentration of SARS-CoV-2 viral copies this assay can detect is 250  copies / mL. A negative result does not preclude SARS-CoV-2 infection  and should not be used as the sole basis for treatment or other  patient management decisions.  A negative result may occur with  improper specimen collection / handling, submission of specimen other  than nasopharyngeal swab, presence of viral mutation(s) within the  areas targeted by this assay,  and inadequate number of viral copies  (<250 copies / mL). A negative result must be combined with clinical  observations, patient history, and epidemiological information. If  result is POSITIVE SARS-CoV-2 target nucleic acids are DETECTED. The SARS-CoV-2 RNA is generally detectable in upper and lower  respiratory specimens dur ing the acute phase of infection.  Positive  results are indicative of active infection with SARS-CoV-2.  Clinical  correlation with patient history and other diagnostic information is  necessary to determine patient infection status.  Positive results do  not rule out bacterial infection or co-infection with other viruses. If result is PRESUMPTIVE POSTIVE SARS-CoV-2 nucleic acids MAY BE PRESENT.   A presumptive positive result was obtained on the submitted specimen  and confirmed on repeat testing.  While 2019 novel coronavirus  (SARS-CoV-2) nucleic acids may be present in the submitted sample  additional confirmatory testing may be necessary for epidemiological  and / or clinical management purposes  to differentiate between  SARS-CoV-2 and other Sarbecovirus currently known to infect humans.  If clinically indicated additional testing with an alternate test  methodology [phone]) is advised. The SARS-CoV-2 RNA is generally  detectable in upper and lower respiratory sp ecimens during the acute  phase of infection. The expected result is Negative. Fact Sheet for Patients:  BoilerBrush.com.cy Fact Sheet for Healthcare Providers: https://pope.com/ This test is not yet approved or cleared by the [place] FDA and has been authorized for detection and/or diagnosis of SARS-CoV-2 by FDA under an Emergency Use Authorization (EUA).  This EUA will remain in effect (meaning this test can be used) for the duration of the COVID-19 declaration under Section 564(b)(1) of the Act, 21 U.S.C. section 360bbb-3(b)(1), unless the authorization is terminated or revoked sooner. Performed at [facility], [address], [place], [place] [address]   Urinalysis, Routine w reflex microscopic      Status: Abnormal   Collection Time: 01/04/19  5:30 PM  Result Value Ref Range   Color, Urine AMBER (A) YELLOW    Comment: BIOCHEMICALS MAY BE AFFECTED BY COLOR   APPearance HAZY (A) CLEAR   Specific Gravity, Urine 1.019 1.005 - 1.030   pH 7.0 5.0 - 8.0   Glucose, UA NEGATIVE NEGATIVE mg/dL   Hgb urine dipstick NEGATIVE NEGATIVE   Bilirubin Urine NEGATIVE NEGATIVE   Ketones, ur 20 (A) NEGATIVE mg/dL   Protein, ur 811 (A) NEGATIVE mg/dL   Nitrite NEGATIVE NEGATIVE   Leukocytes,Ua NEGATIVE NEGATIVE   RBC / HPF 0-5 0 - 5 RBC/hpf   WBC, UA 0-5 0 - 5 WBC/hpf   Bacteria, UA RARE (A) NONE SEEN   Squamous Epithelial / LPF 0-5 0 - 5   Mucus PRESENT     Comment: Performed at [facility], [address] [place], [place] [address]  Urine rapid drug screen (hosp performed)     Status: Abnormal   Collection Time: 01/04/19  5:30 PM  Result Value Ref Range   Opiates NONE DETECTED NONE DETECTED   Cocaine NONE DETECTED NONE DETECTED   Benzodiazepines POSITIVE (A) NONE DETECTED   Amphetamines NONE DETECTED NONE DETECTED   Tetrahydrocannabinol NONE DETECTED NONE DETECTED   Barbiturates NONE DETECTED NONE DETECTED    Comment: (NOTE) DRUG SCREEN FOR MEDICAL PURPOSES ONLY.  IF CONFIRMATION IS NEEDED FOR ANY PURPOSE, NOTIFY LAB WITHIN 5 DAYS. LOWEST DETECTABLE LIMITS FOR URINE DRUG SCREEN Drug Class  Cutoff (ng/mL) Amphetamine and metabolites    1000 Barbiturate and metabolites    200 Benzodiazepine                 200 Tricyclics and metabolites     300 Opiates and metabolites        300 Cocaine and metabolites        300 THC                            50 Performed at [facility], [address], [place], [place] [address]   Lactic acid, plasma     Status: Abnormal   Collection Time: 01/04/19  5:55 PM  Result Value Ref Range   Lactic Acid, Venous 2.1 (HH) 0.5 - 1.9 mmol/L    Comment: CRITICAL RESULT CALLED TO, READ BACK BY AND VERIFIED WITH: Tawny Hopping  6045 01/04/2019 WBOND Performed at [facility], [address], [place], [place] [address]   Culture, blood (x 2)     Status: None (Preliminary result)   Collection Time: 01/04/19  5:55 PM   Specimen: BLOOD  Result Value Ref Range   Specimen Description BLOOD RIGHT ANTECUBITAL    Special Requests      BOTTLES DRAWN AEROBIC AND ANAEROBIC Blood Culture results may not be optimal due to an inadequate volume of blood received in culture bottles   Culture      NO GROWTH < 24 HOURS Performed at [facility], [address], [place], [place] [address]    Report Status PENDING   Culture, blood (x 2)     Status: None (Preliminary result)   Collection Time: 01/04/19 10:27 PM   Specimen: BLOOD  Result Value Ref Range   Specimen Description BLOOD RIGHT ANTECUBITAL    Special Requests      BOTTLES DRAWN AEROBIC AND ANAEROBIC Blood Culture adequate volume   Culture      NO GROWTH < 12 HOURS Performed at [facility], [address], [place], [place] [address]    Report Status PENDING   Lactic acid, plasma     Status: None   Collection Time: 01/04/19 10:57 PM  Result Value Ref Range   Lactic Acid, Venous 1.4 0.5 - 1.9 mmol/L    Comment: Performed at [facility], [address] [place], [place] [address]  TSH     Status: None   Collection Time: 01/05/19  6:46 AM  Result Value Ref Range   TSH 0.977 0.350 - 4.500 uIU/mL    Comment: Performed by a 3rd Generation assay with a functional sensitivity of <=0.01 uIU/mL. Performed at [facility], [address], [place], [place] [address]   CBC     Status: Abnormal   Collection Time: 01/05/19  6:46 AM  Result Value Ref Range   WBC 7.2 4.0 - 10.5 K/uL   RBC 4.41 4.22 - 5.81 MIL/uL   Hemoglobin 14.4 13.0 - 17.0 g/dL   HCT 69.6 29.5 - 28.4 %   MCV 100.2 (H) 80.0 - 100.0 fL   MCH 32.7 26.0 - 34.0 pg   MCHC 32.6 30.0 - 36.0 g/dL   RDW 13.2 44.0 - 10.2 %   Platelets 191 150 - 400 K/uL   nRBC 0.0 0.0 - 0.2 %     Comment: Performed at [facility], [address], [place], [place] [address]  Basic metabolic panel  Status: Abnormal   Collection Time: 01/05/19  6:46 AM  Result Value Ref Range   Sodium 137 135 - 145 mmol/L   Potassium 3.7 3.5 - 5.1 mmol/L   Chloride 106 98 - 111 mmol/L   CO2 21 (L) 22 - 32 mmol/L   Glucose, Bld 103 (H) 70 - 99 mg/dL   BUN 19 8 - 23 mg/dL   Creatinine, Ser 6.96 0.61 - 1.24 mg/dL   Calcium 8.8 (L) 8.9 - 10.3 mg/dL   GFR calc non Af Amer >60 >60 mL/min   GFR calc Af Amer >60 >60 mL/min   Anion gap 10 5 - 15    Comment: Performed at [facility], [address] [place], [place] [address]   Dg Chest Portable 1 View  Result Date: 01/04/2019 CLINICAL DATA:  Altered mental status. EXAM: PORTABLE CHEST 1 VIEW COMPARISON:  None. FINDINGS: Cardiac silhouette is normal in size. No mediastinal or hilar masses or evidence of adenopathy. Linear opacities noted the right lung base consistent with atelectasis or scarring. Lungs otherwise clear. No pleural effusion or pneumothorax. Skeletal structures are demineralized but grossly intact. IMPRESSION: No active disease. Electronically Signed   By: Amie Portland M.D.   On: 01/04/2019 13:37   Ct Head Code Stroke Wo Contrast  Result Date: 01/04/2019 CLINICAL DATA:  Code stroke. Altered level of consciousness, unexplained. Last known normal 11 a.m., left-sided weakness. EXAM: CT HEAD WITHOUT CONTRAST TECHNIQUE: Contiguous axial images were obtained from the base of the skull through the vertex without intravenous contrast. COMPARISON:  Head CT 09/15/2018 FINDINGS: Brain: There is no acute intracranial hemorrhage or demarcated cortical infarct. Chronic right basal ganglia lacunar infarct with involvement of the right caudate nucleus. No evidence of intracranial mass. No midline shift or extra-axial fluid collection. Moderate generalized parenchymal atrophy. Ill-defined hypoattenuation of the cerebral white matter is nonspecific,  but consistent with chronic small vessel ischemic disease. Vascular: No definite hyperdense vessel. Atherosclerotic calcification of the carotid artery siphons. Skull: No calvarial fracture. Sinuses/Orbits: Imaged orbits demonstrate no acute abnormality. Mild scattered paranasal sinus mucosal thickening. No significant mastoid effusion. ASPECTS (Alberta Stroke Program Early CT Score) - Ganglionic level infarction (caudate, lentiform nuclei, internal capsule, insula, M1-M3 cortex): 7 - Supraganglionic infarction (M4-M6 cortex): 3 Total score (0-10 with 10 being normal): 10 (excluding chronic right basal ganglia lacunar infarct.) These results were communicated to Dr. Laurence Slate at 12:22 pmon 9/14/2020by text page via the [facility] messaging system. IMPRESSION: No acute intracranial hemorrhage or demarcated cortical infarct. Generalized parenchymal atrophy with chronic small vessel ischemic disease. Chronic lacunar infarct within the right basal ganglia. Electronically Signed   By: Jackey Loge   On: 01/04/2019 12:23    Pending Labs Unresulted Labs (From admission, onward)    Start     Ordered   01/06/19 0500  CBC with Differential/Platelet  Daily,   R     01/05/19 1311   01/06/19 0500  Comprehensive metabolic panel  Daily,   R     01/05/19 1311   01/06/19 0500  Magnesium  Tomorrow morning,   R     01/05/19 1311          Vitals/Pain Today's Vitals   01/05/19 1400 01/05/19 1415 01/05/19 1430 01/05/19 1500  BP: (!) 150/50  (!) 110/59 (!) 158/104  Pulse: 77 77 79 91  Resp: (!) 9 16 (!) 0 (!) 32  Temp:      TempSrc:      SpO2: 99% 99% 99% Marland Kitchen)  85%  Height:      PainSc:        Isolation Precautions No active isolations  Medications Medications  acetaminophen (TYLENOL) solution 650 mg (650 mg Oral Not Given 01/04/19 1937)  ceFEPIme (MAXIPIME) 2 g in sodium chloride 0.9 % 100 mL IVPB (0 g Intravenous Stopped 01/05/19 0937)  enoxaparin (LOVENOX) injection 40 mg (40 mg Subcutaneous Given 01/05/19 1458)   sodium chloride flush (NS) 0.9 % injection 3 mL (3 mLs Intravenous Given 01/05/19 1000)  ondansetron (ZOFRAN) tablet 4 mg (has no administration in time range)    Or  ondansetron (ZOFRAN) injection 4 mg (has no administration in time range)  0.9 %  sodium chloride infusion ( Intravenous New Bag/Given 01/04/19 1947)  acetaminophen (TYLENOL) tablet 650 mg (has no administration in time range)    Or  acetaminophen (TYLENOL) suppository 650 mg (has no administration in time range)  albuterol (PROVENTIL) (2.5 MG/3ML) 0.083% nebulizer solution 2.5 mg (has no administration in time range)  levothyroxine (SYNTHROID, LEVOTHROID) injection 25 mcg (25 mcg Intravenous Given 01/05/19 1455)  pantoprazole (PROTONIX) injection 40 mg (40 mg Intravenous Given 01/04/19 1945)  MEDLINE mouth rinse (15 mLs Mouth Rinse Not Given 01/05/19 1011)  metoprolol tartrate (LOPRESSOR) tablet 12.5 mg (12.5 mg Oral Not Given 01/05/19 1321)  finasteride (PROSCAR) tablet 5 mg (has no administration in time range)  losartan (COZAAR) tablet 50 mg (50 mg Oral Not Given 01/05/19 1321)  hydrALAZINE (APRESOLINE) injection 10 mg (has no administration in time range)  sodium chloride 0.9 % bolus 500 mL (0 mLs Intravenous Stopped 01/04/19 1500)  metroNIDAZOLE (FLAGYL) IVPB 500 mg (0 mg Intravenous Stopped 01/04/19 1540)  vancomycin (VANCOCIN) IVPB 1000 mg/200 mL premix (0 mg Intravenous Stopping Infusion hung by another clincian 01/04/19 1936)  calcium gluconate 1 g/ 50 mL sodium chloride IVPB (0 mg Intravenous Stopped 01/04/19 2116)  ceFEPIme (MAXIPIME) 2 g in sodium chloride 0.9 % 100 mL IVPB (0 g Intravenous Stopped 01/04/19 2116)  sodium chloride 0.9 % bolus 1,000 mL (0 mLs Intravenous Stopped 01/04/19 1645)    Mobility walks with device High fall risk   Focused Assessments Pulmonary Assessment Handoff:  Lung sounds: Bilateral Breath Sounds: Rhonchi L Breath Sounds: Rhonchi R Breath Sounds: Rhonchi O2 Device: Nasal Cannula O2 Flow  Rate (L/min): 4 L/min      R Recommendations: See Admitting Provider Note  Report given to:   Additional Notes:

## 2019-01-06 ENCOUNTER — Inpatient Hospital Stay (HOSPITAL_COMMUNITY): Payer: Medicare Other

## 2019-01-06 DIAGNOSIS — G9340 Encephalopathy, unspecified: Secondary | ICD-10-CM

## 2019-01-06 LAB — BLOOD CULTURE ID PANEL (REFLEXED)

## 2019-01-06 LAB — COMPREHENSIVE METABOLIC PANEL
ALT: 20 U/L (ref 0–44)
AST: 25 U/L (ref 15–41)
Albumin: 3.2 g/dL — ABNORMAL LOW (ref 3.5–5.0)
Alkaline Phosphatase: 67 U/L (ref 38–126)
Anion gap: 12 (ref 5–15)
BUN: 21 mg/dL (ref 8–23)
CO2: 20 mmol/L — ABNORMAL LOW (ref 22–32)
Calcium: 8.6 mg/dL — ABNORMAL LOW (ref 8.9–10.3)
Chloride: 107 mmol/L (ref 98–111)
Creatinine, Ser: 0.83 mg/dL (ref 0.61–1.24)
GFR calc Af Amer: >60 mL/min (ref >60)
GFR calc non Af Amer: >60 mL/min (ref >60)
Glucose, Bld: 71 mg/dL (ref 70–99)
Potassium: 3.7 mmol/L (ref 3.5–5.1)
Sodium: 139 mmol/L (ref 135–145)
Total Bilirubin: 1.3 mg/dL — ABNORMAL HIGH (ref 0.3–1.2)
Total Protein: 5.7 g/dL — ABNORMAL LOW (ref 6.5–8.1)

## 2019-01-06 LAB — CBC WITH DIFFERENTIAL/PLATELET
Abs Immature Granulocytes: 0.02 10*3/uL (ref 0.00–0.07)
Basophils Absolute: 0.1 10*3/uL (ref 0.0–0.1)
Basophils Relative: 1 %
Eosinophils Absolute: 0.1 10*3/uL (ref 0.0–0.5)
Eosinophils Relative: 2 %
HCT: 41.3 % (ref 39.0–52.0)
Hemoglobin: 14.2 g/dL (ref 13.0–17.0)
Immature Granulocytes: 0 %
Lymphocytes Relative: 14 %
Lymphs Abs: 0.9 10*3/uL (ref 0.7–4.0)
MCH: 34 pg (ref 26.0–34.0)
MCHC: 34.4 g/dL (ref 30.0–36.0)
MCV: 98.8 fL (ref 80.0–100.0)
Monocytes Absolute: 0.7 10*3/uL (ref 0.1–1.0)
Monocytes Relative: 11 %
Neutro Abs: 4.3 10*3/uL (ref 1.7–7.7)
Neutrophils Relative %: 72 %
Platelets: 186 10*3/uL (ref 150–400)
RBC: 4.18 MIL/uL — ABNORMAL LOW (ref 4.22–5.81)
RDW: 12.7 % (ref 11.5–15.5)
WBC: 6.1 10*3/uL (ref 4.0–10.5)
nRBC: 0 % (ref 0.0–0.2)

## 2019-01-06 LAB — GLUCOSE, CAPILLARY
Glucose-Capillary: 66 mg/dL — ABNORMAL LOW (ref 70–99)
Glucose-Capillary: 98 mg/dL (ref 70–99)

## 2019-01-06 LAB — MAGNESIUM: Magnesium: 1.9 mg/dL (ref 1.7–2.4)

## 2019-01-06 MED ORDER — LORAZEPAM 2 MG/ML IJ SOLN
1.0000 mg | INTRAMUSCULAR | Status: AC
  Administered 2019-01-06: 09:00:00 1 mg via INTRAVENOUS
  Filled 2019-01-06: qty 1

## 2019-01-06 MED ORDER — POTASSIUM CHLORIDE 2 MEQ/ML IV SOLN: INTRAVENOUS | Status: DC

## 2019-01-06 MED ORDER — DEXTROSE 50 % IV SOLN
INTRAVENOUS | Status: AC
  Filled 2019-01-06: qty 50

## 2019-01-06 MED ORDER — DEXTROSE 50 % IV SOLN
12.5000 g | INTRAVENOUS | Status: AC
  Administered 2019-01-06: 05:00:00 12.5 g via INTRAVENOUS

## 2019-01-06 MED ORDER — KCL IN DEXTROSE-NACL 40-5-0.9 MEQ/L-%-% IV SOLN
INTRAVENOUS | Status: DC
  Administered 2019-01-06 – 2019-01-07 (×4): via INTRAVENOUS
  Filled 2019-01-06 (×5): qty 1000

## 2019-01-06 MED ORDER — SODIUM CHLORIDE 0.9 % IV SOLN
2.0000 g | INTRAVENOUS | Status: AC
  Administered 2019-01-06 – 2019-01-07 (×2): 2 g via INTRAVENOUS
  Filled 2019-01-06 (×2): qty 20

## 2019-01-06 MED ORDER — METOPROLOL TARTRATE 5 MG/5ML IV SOLN
2.5000 mg | Freq: Three times a day (TID) | INTRAVENOUS | Status: DC
  Administered 2019-01-06 – 2019-01-12 (×15): 2.5 mg via INTRAVENOUS
  Filled 2019-01-06 (×17): qty 5

## 2019-01-06 NOTE — Progress Notes (Signed)
PHARMACY - PHYSICIAN COMMUNICATION CRITICAL VALUE ALERT - BLOOD CULTURE IDENTIFICATION (BCID)  Casey Andrade is an 85 y.o. male who presented to [facility] on 01/04/2019 with a chief complaint of sepsis/AMS. Currently concerned for aspiration PNA being treated with cefepime. Recommended to de-escalate abx to ceftriaxone for 2 additional days to complete a PNA treatment. Waiting on response.   Assessment:  Coagulase negative staph 1/4 bottles no MecA detected. Likely contaminant.  Name of physician (or Provider) Contacted: Darliss Cheney, MD  Current antibiotics: cefepime   Changes to prescribed antibiotics recommended:  Recommendations accepted by provider  Still on cefepime - recommended to switch to CTX for 2 additional days - follow up    Results for orders placed or performed during the hospital encounter of 01/04/19  Blood Culture ID Panel (Reflexed) (Collected: 01/04/2019  5:55 PM)  Result Value Ref Range   Enterococcus species NOT DETECTED NOT DETECTED   Listeria monocytogenes NOT DETECTED NOT DETECTED   Staphylococcus species DETECTED (A) NOT DETECTED   Staphylococcus aureus (BCID) NOT DETECTED NOT DETECTED   Methicillin resistance NOT DETECTED NOT DETECTED   Streptococcus species NOT DETECTED NOT DETECTED   Streptococcus agalactiae NOT DETECTED NOT DETECTED   Streptococcus pneumoniae NOT DETECTED NOT DETECTED   Streptococcus pyogenes NOT DETECTED NOT DETECTED   Acinetobacter baumannii NOT DETECTED NOT DETECTED   Enterobacteriaceae species NOT DETECTED NOT DETECTED   Enterobacter cloacae complex NOT DETECTED NOT DETECTED   Escherichia coli NOT DETECTED NOT DETECTED   Klebsiella oxytoca NOT DETECTED NOT DETECTED   Klebsiella pneumoniae NOT DETECTED NOT DETECTED   Proteus species NOT DETECTED NOT DETECTED   Serratia marcescens NOT DETECTED NOT DETECTED   Haemophilus influenzae NOT DETECTED NOT DETECTED   Neisseria meningitidis NOT DETECTED NOT DETECTED   Pseudomonas  aeruginosa NOT DETECTED NOT DETECTED   Candida albicans NOT DETECTED NOT DETECTED   Candida glabrata NOT DETECTED NOT DETECTED   Candida krusei NOT DETECTED NOT DETECTED   Candida parapsilosis NOT DETECTED NOT DETECTED   Candida tropicalis NOT DETECTED NOT DETECTED    Phillis Haggis 01/06/2019  2:10 PM

## 2019-01-06 NOTE — Progress Notes (Addendum)
Family requesting patient not to receive any other sedation ie ativan.    Addendum:  Can we have a PT evaluation?

## 2019-01-06 NOTE — Progress Notes (Signed)
Reviewed Repeat CT head, negative for acute stroke. Neurology will be available as needed.

## 2019-01-06 NOTE — Plan of Care (Signed)
Pt is confused 

## 2019-01-06 NOTE — Progress Notes (Signed)
PROGRESS NOTE    Casey ShellRobert M Junkin  ZOX:[id number] DOB: Jan 13, 1920 DOA: 01/04/2019 PCP: Catha GosselinLittle, Kevin, MD   Brief Narrative:  Casey Andrade is a 85 y.o. male with medical history significant of hypertension, hypertrophic cardiomyopathy last EF 65 to 70%, dementia, hypothyroidism, hyponatremia, and gait disturbance; who was brought into the ED after being found acutely altered.    Reportedly, he was Previously noted to be lethargic but arousable and speaking 15 minutes prior.  Upon EMS arrival he was initially placed on a nonrebreather and patient appeared flaccid on the left side with some muscle tone reported on the right.  Initially suspected stroke versus aspiration.  Per daughter, patient has had chronic productive cough, but had increased sputum production although reported to be clear in the last week or so.  Otherwise patient had been noted to be in fairly decent health for his age and utilized a rolling walker to ambulate as he was high falls risk.  Reportedly Patient was given Haldol afternoon around 4:30 p.m a day prior.according to daughter, he was previously given Haldol he slept for over 24 hours thereafter.  On admission into the emergency department patient was found to be febrile up to 100.8 F rectally, respirations 16-26, blood pressure noted as 97/45, and O2 saturation maintained currently on 1 L nasal cannula oxygen. CT scan of the brain was negative for any acute abnormalities, and code stroke had been canceled after patient was evaluated by neurology.  Chest x-ray showed no acute abnormalities. Labs significant for WBC 14.8.  COVID-19 negative.  No initial lactic acid was obtained.  Patient was started on empiric antibiotics of vancomycin, metronidazole, and cefepime.  Assessment & Plan:   Principal Problem:   Sepsis (HCC) Active Problems:   HOCM (hypertrophic obstructive cardiomyopathy) (HCC)   Hypothyroidism   Transient hypotension   Encephalopathy  Acute metabolic  encephalopathy vs bleeding: Likely multifactorial, could be due to acute infection, aspiration pneumonia versus Haldol.  Patient was slightly agitated this morning.  He was going for CT scan.  1 mg IV Ativan given.  Will place him on delirium precautions.  Keep room late during the day.  Given 1 dose of Seroquel tonight to help him sleep.  CT of the head was done yesterday which was unremarkable.  Neurology had discussed about proceeding with MRI with the family but they opted not to.  Eventually neurology obtained another CT of the head which is unremarkable now.  Patient does not seem to have any neurological deficit.  Possible aspiration pneumonia: chest x-ray suggests no active disease however radiological findings could lag behind clinical findings and he had fever of 100.8 and due to altered mental status, he may have aspirated.  Switch to Rocephin..  Follow culture.  Dehydration/decreased urine output: We will increase IV fluid to 100 cc/h.  Monitor I's and O's strictly.  Hypothyroidism: Continue Synthroid.  History of hypertension with transient hypotension: Blood pressure fluctuating but acceptable.  Continue PRN hydralazine.  DVT prophylaxis: Lovenox Code Status: DNR Family Communication: Plan of care discussed with the daughter Gracy BruinsRoseane again today.  Per our discussion, we will consult palliative care. Disposition Plan: TBD.  Hopefully discharge in next 1 to 2 days.  Consultants:   None  Procedures:   None  Antimicrobials:   Cefepime> 01/06/2019  Rocephin 01/06/2019   Subjective: Patient seen and examined early this morning.  Family was not present at bedside.  He was getting ready to go for CT scan.  He was slightly agitated.  He was still altered.  Objective: Vitals:   01/06/19 0424 01/06/19 0555 01/06/19 0758 01/06/19 1210  BP: (!) 168/80 (!) 100/47  135/69  Pulse: 94 100  95  Resp: 14 18  (!) 0  Temp: 98.2 F (36.8 C)  98.3 F (36.8 C) 98.2 F (36.8 C)  TempSrc:  Oral  Oral Axillary  SpO2: 99% 97%  99%  Weight:      Height:        Intake/Output Summary (Last 24 hours) at 01/06/2019 1516 Last data filed at 01/06/2019 0900 Gross per 24 hour  Intake 1943.04 ml  Output 400 ml  Net 1543.04 ml   Filed Weights   01/05/19 2308 01/06/19 0348  Weight: 64.9 kg 65.8 kg    Examination:  General exam: Agitated Respiratory system: Was unable to auscultate well since he kept moaning Cardiovascular system: S1 & S2 heard, RRR. No JVD, murmurs, rubs, gallops or clicks. No pedal edema. Gastrointestinal system: Abdomen is nondistended, soft and nontender. No organomegaly or masses felt. Normal bowel sounds heard. Central nervous system: Confused but alert. Extremities: Symmetric 5 x 5 power. Skin: No rashes, lesions or ulcers.  Psychiatry: Unable to assess due to agitation.   Data Reviewed: I have personally reviewed following labs and imaging studies  CBC: Recent Labs  Lab 01/04/19 1300 01/04/19 1308 01/05/19 0646 01/06/19 0214  WBC 14.8*  --  7.2 6.1  NEUTROABS 13.3*  --   --  4.3  HGB 14.3 14.3 14.4 14.2  HCT 42.9 42.0 44.2 41.3  MCV 98.6  --  100.2* 98.8  PLT 218  --  191 186   Basic Metabolic Panel: Recent Labs  Lab 01/04/19 1300 01/04/19 1308 01/05/19 0646 01/06/19 0214  NA 136 137 137 139  K 3.8 4.1 3.7 3.7  CL 104 101 106 107  CO2 21*  --  21* 20*  GLUCOSE 118* 116* 103* 71  BUN 16 21 19 21   CREATININE 0.75 0.60* 0.87 0.83  CALCIUM 8.7*  --  8.8* 8.6*  MG  --   --   --  1.9   GFR: Estimated Creatinine Clearance: 45.9 mL/min (by C-G formula based on SCr of 0.83 mg/dL). Liver Function Tests: Recent Labs  Lab 01/04/19 1300 01/06/19 0214  AST 23 25  ALT 20 20  ALKPHOS 76 67  BILITOT 1.3* 1.3*  PROT 5.9* 5.7*  ALBUMIN 3.3* 3.2*   No results for input(s): LIPASE, AMYLASE in the last 168 hours. No results for input(s): AMMONIA in the last 168 hours. Coagulation Profile: Recent Labs  Lab 01/04/19 1300  INR 1.0    Cardiac Enzymes: No results for input(s): CKTOTAL, CKMB, CKMBINDEX, TROPONINI in the last 168 hours. BNP (last 3 results) No results for input(s): PROBNP in the last 8760 hours. HbA1C: No results for input(s): HGBA1C in the last 72 hours. CBG: Recent Labs  Lab 01/04/19 1155 01/06/19 0442 01/06/19 0650  GLUCAP 112* 66* 98   Lipid Profile: No results for input(s): CHOL, HDL, LDLCALC, TRIG, CHOLHDL, LDLDIRECT in the last 72 hours. Thyroid Function Tests: Recent Labs    01/05/19 0646  TSH 0.977   Anemia Panel: No results for input(s): VITAMINB12, FOLATE, FERRITIN, TIBC, IRON, RETICCTPCT in the last 72 hours. Sepsis Labs: Recent Labs  Lab 01/04/19 1327 01/04/19 1755 01/04/19 2257  PROCALCITON <0.10  --   --   LATICACIDVEN  --  2.1* 1.4    Recent Results (from the past 240 hour(s))  SARS Coronavirus 2 [facility] order,  Performed in [facility] hospital lab) Nasopharyngeal Nasopharyngeal Swab     Status: None   Collection Time: 01/04/19  1:50 PM   Specimen: Nasopharyngeal Swab  Result Value Ref Range Status   SARS Coronavirus 2 NEGATIVE NEGATIVE Final    Comment: (NOTE) If result is NEGATIVE SARS-CoV-2 target nucleic acids are NOT DETECTED. The SARS-CoV-2 RNA is generally detectable in upper and lower  respiratory specimens during the acute phase of infection. The lowest  concentration of SARS-CoV-2 viral copies this assay can detect is 250  copies / mL. A negative result does not preclude SARS-CoV-2 infection  and should not be used as the sole basis for treatment or other  patient management decisions.  A negative result may occur with  improper specimen collection / handling, submission of specimen other  than nasopharyngeal swab, presence of viral mutation(s) within the  areas targeted by this assay, and inadequate number of viral copies  (<250 copies / mL). A negative result must be combined with clinical  observations, patient history, and epidemiological  information. If result is POSITIVE SARS-CoV-2 target nucleic acids are DETECTED. The SARS-CoV-2 RNA is generally detectable in upper and lower  respiratory specimens dur ing the acute phase of infection.  Positive  results are indicative of active infection with SARS-CoV-2.  Clinical  correlation with patient history and other diagnostic information is  necessary to determine patient infection status.  Positive results do  not rule out bacterial infection or co-infection with other viruses. If result is PRESUMPTIVE POSTIVE SARS-CoV-2 nucleic acids MAY BE PRESENT.   A presumptive positive result was obtained on the submitted specimen  and confirmed on repeat testing.  While 2019 novel coronavirus  (SARS-CoV-2) nucleic acids may be present in the submitted sample  additional confirmatory testing may be necessary for epidemiological  and / or clinical management purposes  to differentiate between  SARS-CoV-2 and other Sarbecovirus currently known to infect humans.  If clinically indicated additional testing with an alternate test  methodology [phone]) is advised. The SARS-CoV-2 RNA is generally  detectable in upper and lower respiratory sp ecimens during the acute  phase of infection. The expected result is Negative. Fact Sheet for Patients:  BoilerBrush.com.cy Fact Sheet for Healthcare Providers: https://pope.com/ This test is not yet approved or cleared by the [place] FDA and has been authorized for detection and/or diagnosis of SARS-CoV-2 by FDA under an Emergency Use Authorization (EUA).  This EUA will remain in effect (meaning this test can be used) for the duration of the COVID-19 declaration under Section 564(b)(1) of the Act, 21 U.S.C. section 360bbb-3(b)(1), unless the authorization is terminated or revoked sooner. Performed at [facility], [address], [place], [place] [address]   Culture, blood (x 2)      Status: None (Preliminary result)   Collection Time: 01/04/19  5:55 PM   Specimen: BLOOD  Result Value Ref Range Status   Specimen Description BLOOD RIGHT ANTECUBITAL  Final   Special Requests   Final    BOTTLES DRAWN AEROBIC AND ANAEROBIC Blood Culture results may not be optimal due to an inadequate volume of blood received in culture bottles   Culture  Setup Time   Final    GRAM POSITIVE COCCI ANAEROBIC BOTTLE ONLY CRITICAL RESULT CALLED TO, READ BACK BY AND VERIFIED WITH: T. Rubin Payor, PHARMD AT 1405 ON 01/06/19 BY C. JESSUP, MT.    Culture   Final    CULTURE REINCUBATED FOR BETTER GROWTH Performed at [facility], [address] [place], [place] [address]    Report Status PENDING  Incomplete  Blood Culture ID Panel (Reflexed)     Status: Abnormal   Collection Time: 01/04/19  5:55 PM  Result Value Ref Range Status   Enterococcus species NOT DETECTED NOT DETECTED Final   Listeria monocytogenes NOT DETECTED NOT DETECTED Final   Staphylococcus species DETECTED (A) NOT DETECTED Final    Comment: Methicillin (oxacillin) susceptible coagulase negative staphylococcus. Possible blood culture contaminant (unless isolated from more than one blood culture draw or clinical case suggests pathogenicity). No antibiotic treatment is indicated for blood  culture contaminants. CRITICAL RESULT CALLED TO, READ BACK BY AND VERIFIED WITH: T. BAUMEISTER, PHARMD AT 1405 ON 01/06/19 BY C. JESSUP, MT.    Staphylococcus aureus (BCID) NOT DETECTED NOT DETECTED Final   Methicillin resistance NOT DETECTED NOT DETECTED Final   Streptococcus species NOT DETECTED NOT DETECTED Final   Streptococcus agalactiae NOT DETECTED NOT DETECTED Final   Streptococcus pneumoniae NOT DETECTED NOT DETECTED Final   Streptococcus pyogenes NOT DETECTED NOT DETECTED Final   Acinetobacter baumannii NOT DETECTED NOT DETECTED Final   Enterobacteriaceae species NOT DETECTED NOT DETECTED Final   Enterobacter cloacae complex NOT  DETECTED NOT DETECTED Final   Escherichia coli NOT DETECTED NOT DETECTED Final   Klebsiella oxytoca NOT DETECTED NOT DETECTED Final   Klebsiella pneumoniae NOT DETECTED NOT DETECTED Final   Proteus species NOT DETECTED NOT DETECTED Final   Serratia marcescens NOT DETECTED NOT DETECTED Final   Haemophilus influenzae NOT DETECTED NOT DETECTED Final   Neisseria meningitidis NOT DETECTED NOT DETECTED Final   Pseudomonas aeruginosa NOT DETECTED NOT DETECTED Final   Candida albicans NOT DETECTED NOT DETECTED Final   Candida glabrata NOT DETECTED NOT DETECTED Final   Candida krusei NOT DETECTED NOT DETECTED Final   Candida parapsilosis NOT DETECTED NOT DETECTED Final   Candida tropicalis NOT DETECTED NOT DETECTED Final    Comment: Performed at [facility], [address] [place], [place] [address]  Culture, blood (x 2)     Status: None (Preliminary result)   Collection Time: 01/04/19 10:27 PM   Specimen: BLOOD  Result Value Ref Range Status   Specimen Description BLOOD RIGHT ANTECUBITAL  Final   Special Requests   Final    BOTTLES DRAWN AEROBIC AND ANAEROBIC Blood Culture adequate volume   Culture   Final    NO GROWTH 2 DAYS Performed at [facility], [address] [place], [place] [address]    Report Status PENDING  Incomplete  MRSA PCR Screening     Status: None   Collection Time: 01/05/19  4:28 PM   Specimen: Nasal Mucosa; Nasopharyngeal  Result Value Ref Range Status   MRSA by PCR NEGATIVE NEGATIVE Final    Comment:        The GeneXpert MRSA Assay (FDA approved for NASAL specimens only), is one component of a comprehensive MRSA colonization surveillance program. It is not intended to diagnose MRSA infection nor to guide or monitor treatment for MRSA infections. Performed at [facility], [address] [place], [place] [address]       Radiology Studies: No results found.  Scheduled Meds: . acetaminophen (TYLENOL) oral liquid 160 mg/5 mL  650 mg  Oral Once  . dextrose      . enoxaparin (LOVENOX) injection  40 mg Subcutaneous Q24H  . finasteride  5 mg Oral QHS  . levothyroxine  25 mcg Intravenous Daily  .  losartan  50 mg Oral Daily  . mouth rinse  15 mL Mouth Rinse BID  . metoprolol tartrate  12.5 mg Oral BID  . pantoprazole (PROTONIX) IV  40 mg Intravenous Q24H  . sodium chloride flush  3 mL Intravenous Q12H   Continuous Infusions: . ceFEPime (MAXIPIME) IV 2 g (01/06/19 0830)  . dextrose 5 % and 0.9 % NaCl with KCl 40 mEq/L 100 mL/hr at 01/06/19 0937     LOS: 2 days   Time spent: 34 minutes   Darliss Cheney, MD [facility] Pager [phone]  If 7PM-7AM, please contact night-coverage [email or web address] Password [facility] 01/06/2019, 3:16 PM

## 2019-01-06 NOTE — Progress Notes (Signed)
   01/06/19 2041  MEWS Score  Resp 19  ECG Heart Rate 88  Pulse Rate 85  BP (!) 159/79  Temp 98.3 F (36.8 C)  Level of Consciousness Alert  SpO2 98 %  O2 Device Room Air  MEWS Score  MEWS RR 0  MEWS Pulse 0  MEWS Systolic 0  MEWS LOC 0  MEWS Temp 0  MEWS Score 0  MEWS Score Color Green  MEWS Assessment  Is this an acute change? No  MEWS Guidelines - (patients age 83 and over)  Red - At High Risk for Deterioration Yellow - At risk for Deterioration  1. Go to room and assess patient 2. Validate data. Is this patient's baseline? If data confirmed: 3. Is this an acute change? 4. Administer prn meds/treatments as ordered. 5. Note Sepsis score 6. Review goals of care 7. Sports coach, RRT nurse and Provider. 8. Ask Provider to come to bedside.  9. Document patient condition/interventions/response. 10. Increase frequency of vital signs and focused assessments to at least q15 minutes x 4, then q30 minutes x2. - If stable, then q1h x3, then q4h x3 and then q8h or dept. routine. - If unstable, contact Provider & RRT nurse. Prepare for possible transfer. 11. Add entry in progress notes using the smart phrase ".MEWS". 1. Go to room and assess patient 2. Validate data. Is this patient's baseline? If data confirmed: 3. Is this an acute change? 4. Administer prn meds/treatments as ordered? 5. Note Sepsis score 6. Review goals of care 7. Sports coach and Provider 8. Call RRT nurse as needed. 9. Document patient condition/interventions/response. 10. Increase frequency of vital signs and focused assessments to at least q2h x2. - If stable, then q4h x2 and then q8h or dept. routine. - If unstable, contact Provider & RRT nurse. Prepare for possible transfer. 11. Add entry in progress notes using the smart phrase ".MEWS".  Green - Likely stable Lavender - Comfort Care Only  1. Continue routine/ordered monitoring.  2. Review goals of care. 1. Continue routine/ordered  monitoring. 2. Review goals of care.

## 2019-01-06 NOTE — Progress Notes (Addendum)
Spot check BG with a reading of 66@ 0442.  12.5g  D50 given per protocol  with improvement of  BG to 98

## 2019-01-06 NOTE — Evaluation (Signed)
Clinical/Bedside Swallow Evaluation Patient Details  Name: Casey Andrade MRN: [id number] Date of Birth: 1919-11-28  Today's Date: 01/06/2019 Time: SLP Start Time (ACUTE ONLY): 0071 SLP Stop Time (ACUTE ONLY): 0904 SLP Time Calculation (min) (ACUTE ONLY): 12 min  Past Medical History:  Past Medical History:  Diagnosis Date  . Dyspnea    increasing  . Essential hypertension   . History of echocardiogram 04/27/2010   EF was in the range of 65-70% / Mitral valve, systolic anterior motion of the anterior mitral chordae               . Hypertension   . Hypertr obst cardiomyop   . Hyponatremia    for which nephrology is seeing him  . Hypothyroidism   . 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 Murmur    noted to have loud murmur  . Right bundle branch block   . Weakness    chronic weakness without syncope   Past Surgical History:  Past Surgical History:  Procedure Laterality Date  . TRANSTHORACIC ECHOCARDIOGRAM  04/2010   Vigorous LV function with systolic anterior motion of the anterior mitral valve mild dynamic LVOT obstruction with peak gradient 70 mmHg. EF 65-70%. GR 1 DD. Moderate-severe MR   HPI:  Casey Andrade is a 85 y.o. male with medical history significant of hypertension, hypertrophic cardiomyopathy last EF 65 to 70%, dementia, hypothyroidism, hyponatremia, and gait disturbance; who presents after being found acutely altered.  Pt's daughter reported that pt has a baseline productive cough, but sputum production has increased in the last week.  Pt initially appeared to be flaccid on L side; however, head CT was clear.  CXR on  9/14 was unremarkable.     Assessment / Plan / Recommendation Clinical Impression  Pt presents with dysphagia in the setting of acute AMS.  Dysphagia is primarily related to current cognitive state. Pt was encountered lethargic and agitated in bed with bilateral mitts  in place.  He additionally was observed to have wet vocal quality and a congested cough at baseline.  Pt exhibited continual moaning throughout this evaluation and he was oriented x0 despite being given a choice of 2 to answer/nod "yes" or "no".   Pt was observed with trials of thin liquid via spoon x2.  During first trial pt immediately expelled the  bolus from his oral cavity.  During the second trial, but exhibited improved bolus acceptance; however, he had poor labial closure and exhibited R labial spillage.  No swallow initiation was observed and pt presented with a delayed, wet cough following this trial.  No additional po trials were attempted on this date.  Due to baseline wet vocal quality and current cognitive state, suspect that pt may be aspirating his secretions; therefore recommend frequent oral care.  Additionally recommend continuation of NPO at this time.  Suspect that pt's tolerance of po trials will improve as mentation improves. ST will continue to follow to determine readiness for diet initiation.  SLP Visit Diagnosis: Dysphagia, unspecified (R13.10)    Aspiration Risk  Severe aspiration risk    Diet Recommendation NPO   Medication Administration: Via alternative means    Other  Recommendations Oral Care Recommendations: Oral care QID   Follow up Recommendations Skilled Nursing facility      Frequency and Duration min 2x/week  2 weeks       Prognosis Prognosis for Safe Diet Advancement: Guarded  Barriers to Reach Goals: Cognitive deficits;Behavior      Swallow Study   General HPI: Casey Andrade is a 85 y.o. male with medical history significant of hypertension, hypertrophic cardiomyopathy last EF 65 to 70%, dementia, hypothyroidism, hyponatremia, and gait disturbance; who presents after being found acutely altered.  Pt's daughter reported that pt has a baseline productive cough, but sputum production has increased in the last week.  Pt initially appeared to be flaccid  on L side; however, head CT was clear.  CXR on  9/14 was unremarkable.   Type of Study: Bedside Swallow Evaluation Diet Prior to this Study: NPO Temperature Spikes Noted: Yes Respiratory Status: Room air History of Recent Intubation: No Behavior/Cognition: Confused;Agitated;Lethargic/Drowsy Oral Cavity Assessment: Within Functional Limits Oral Care Completed by SLP: No Oral Cavity - Dentition: Other (Comment)(Unable to assess ) Self-Feeding Abilities: Other (Comment)(Pt is bilateral mitts) Patient Positioning: Upright in bed Baseline Vocal Quality: Wet Volitional Cough: Congested;Wet Volitional Swallow: Unable to elicit    Oral/Motor/Sensory Function Overall Oral Motor/Sensory Function: Other (comment)(Unable to evaluate)   Ice Chips Ice chips: Not tested   Thin Liquid Thin Liquid: Impaired Presentation: Spoon;Straw Oral Phase Impairments: Reduced labial seal;Reduced lingual movement/coordination;Poor awareness of bolus Oral Phase Functional Implications: Right anterior spillage Pharyngeal  Phase Impairments: Wet Vocal Quality;Cough - Delayed;Unable to trigger swallow    Nectar Thick Nectar Thick Liquid: Not tested   Honey Thick Honey Thick Liquid: Not tested   Puree Puree: Not tested   Solid    Casey Andrade, M.S., Casey Andrade [facility] Office: [phone]   Solid: Not tested      Casey Andrade 01/06/2019,9:28 AM

## 2019-01-06 NOTE — Plan of Care (Signed)
  Problem: Education: Goal: Knowledge of General Education information will improve Description: Including pain rating scale, medication(s)/side effects and non-pharmacologic comfort measures Outcome: Not Progressing Note: Confusion   Problem: Health Behavior/Discharge Planning: Goal: Ability to manage health-related needs will improve Outcome: Not Progressing Note: Confusion   Problem: Clinical Measurements: Goal: Ability to maintain clinical measurements within normal limits will improve Outcome: Progressing Goal: Will remain free from infection Outcome: Progressing Goal: Diagnostic test results will improve Outcome: Progressing Goal: Respiratory complications will improve Outcome: Progressing Goal: Cardiovascular complication will be avoided Outcome: Progressing   Problem: Activity: Goal: Risk for activity intolerance will decrease Outcome: Not Progressing Note: Immobility   Problem: Nutrition: Goal: Adequate nutrition will be maintained Outcome: Progressing   Problem: Coping: Goal: Level of anxiety will decrease Outcome: Progressing   Problem: Elimination: Goal: Will not experience complications related to bowel motility Outcome: Progressing Goal: Will not experience complications related to urinary retention Outcome: Progressing   Problem: Pain Managment: Goal: General experience of comfort will improve Outcome: Progressing   Problem: Safety: Goal: Ability to remain free from injury will improve Outcome: Progressing   Problem: Skin Integrity: Goal: Risk for impaired skin integrity will decrease Outcome: Not Progressing Note: Immobility

## 2019-01-07 ENCOUNTER — Inpatient Hospital Stay (HOSPITAL_COMMUNITY): Payer: Medicare Other

## 2019-01-07 DIAGNOSIS — R4182 Altered mental status, unspecified: Secondary | ICD-10-CM

## 2019-01-07 DIAGNOSIS — Z515 Encounter for palliative care: Secondary | ICD-10-CM

## 2019-01-07 DIAGNOSIS — F0280 Dementia in other diseases classified elsewhere without behavioral disturbance: Secondary | ICD-10-CM

## 2019-01-07 DIAGNOSIS — F0390 Unspecified dementia without behavioral disturbance: Secondary | ICD-10-CM

## 2019-01-07 DIAGNOSIS — I421 Obstructive hypertrophic cardiomyopathy: Secondary | ICD-10-CM

## 2019-01-07 DIAGNOSIS — R410 Disorientation, unspecified: Secondary | ICD-10-CM

## 2019-01-07 LAB — COMPREHENSIVE METABOLIC PANEL
ALT: 21 U/L (ref 0–44)
AST: 39 U/L (ref 15–41)
Albumin: 3.3 g/dL — ABNORMAL LOW (ref 3.5–5.0)
Alkaline Phosphatase: 65 U/L (ref 38–126)
Anion gap: 11 (ref 5–15)
BUN: 17 mg/dL (ref 8–23)
CO2: 20 mmol/L — ABNORMAL LOW (ref 22–32)
Calcium: 8.7 mg/dL — ABNORMAL LOW (ref 8.9–10.3)
Chloride: 110 mmol/L (ref 98–111)
Creatinine, Ser: 0.76 mg/dL (ref 0.61–1.24)
GFR calc Af Amer: >60 mL/min (ref >60)
GFR calc non Af Amer: >60 mL/min (ref >60)
Glucose, Bld: 116 mg/dL — ABNORMAL HIGH (ref 70–99)
Potassium: 3.6 mmol/L (ref 3.5–5.1)
Sodium: 141 mmol/L (ref 135–145)
Total Bilirubin: 0.5 mg/dL (ref 0.3–1.2)
Total Protein: 6 g/dL — ABNORMAL LOW (ref 6.5–8.1)

## 2019-01-07 LAB — CBC WITH DIFFERENTIAL/PLATELET
Abs Immature Granulocytes: 0.03 10*3/uL (ref 0.00–0.07)
Basophils Absolute: 0.1 10*3/uL (ref 0.0–0.1)
Basophils Relative: 1 %
Eosinophils Absolute: 0.1 10*3/uL (ref 0.0–0.5)
Eosinophils Relative: 2 %
HCT: 40.9 % (ref 39.0–52.0)
Hemoglobin: 13.4 g/dL (ref 13.0–17.0)
Immature Granulocytes: 1 %
Lymphocytes Relative: 15 %
Lymphs Abs: 1 10*3/uL (ref 0.7–4.0)
MCH: 32.3 pg (ref 26.0–34.0)
MCHC: 32.8 g/dL (ref 30.0–36.0)
MCV: 98.6 fL (ref 80.0–100.0)
Monocytes Absolute: 0.8 10*3/uL (ref 0.1–1.0)
Monocytes Relative: 13 %
Neutro Abs: 4.2 10*3/uL (ref 1.7–7.7)
Neutrophils Relative %: 68 %
Platelets: 204 10*3/uL (ref 150–400)
RBC: 4.15 MIL/uL — ABNORMAL LOW (ref 4.22–5.81)
RDW: 12.9 % (ref 11.5–15.5)
WBC: 6.2 10*3/uL (ref 4.0–10.5)
nRBC: 0 % (ref 0.0–0.2)

## 2019-01-07 MED ORDER — QUETIAPINE FUMARATE 25 MG PO TABS: 25.0000 mg | ORAL_TABLET | Freq: Every day | ORAL | Status: DC

## 2019-01-07 MED ORDER — OLANZAPINE 5 MG PO TBDP
2.5000 mg | ORAL_TABLET | Freq: Every day | ORAL | Status: DC
  Administered 2019-01-07 – 2019-01-11 (×5): 2.5 mg via ORAL
  Filled 2019-01-07 (×6): qty 0.5

## 2019-01-07 MED ORDER — RESOURCE THICKENUP CLEAR PO POWD
ORAL | Status: DC | PRN
  Filled 2019-01-07 (×2): qty 125

## 2019-01-07 NOTE — Progress Notes (Signed)
Notified by central tele that patient had a 9-beat run of v-tach at 0249.  Pt is asymptomatic on assessment.  Vitals BP 146/71 (92), T 98, P 81, RR 15, SaO2 94% RA.  Current rhythm NSR.  Paged [facility] provider Tylene Fantasia, NP, no new orders received.

## 2019-01-07 NOTE — Progress Notes (Signed)
PROGRESS NOTE    JUN OSMENT  ZOX:[id number] DOB: Dec 14, 1919 DOA: 01/04/2019 PCP: Catha Gosselin, MD   Brief Narrative:  Casey Andrade is a 85 y.o. male with medical history significant of hypertension, hypertrophic cardiomyopathy last EF 65 to 70%, dementia, hypothyroidism, hyponatremia, and gait disturbance; who was brought into the ED after being found acutely altered.    Reportedly, he was Previously noted to be lethargic but arousable and speaking 15 minutes prior.  Upon EMS arrival he was initially placed on a nonrebreather and patient appeared flaccid on the left side with some muscle tone reported on the right.  Initially suspected stroke versus aspiration.  Per daughter, patient has had chronic productive cough, but had increased sputum production although reported to be clear in the last week or so.  Otherwise patient had been noted to be in fairly decent health for his age and utilized a rolling walker to ambulate as he was high falls risk.  Reportedly Patient was given Haldol afternoon around 4:30 p.m a day prior.according to daughter, he was previously given Haldol he slept for over 24 hours thereafter.  On admission into the emergency department patient was found to be febrile up to 100.8 F rectally, respirations 16-26, blood pressure noted as 97/45, and O2 saturation maintained currently on 1 L nasal cannula oxygen. CT scan of the brain was negative for any acute abnormalities, and code stroke had been canceled after patient was evaluated by neurology.  Chest x-ray showed no acute abnormalities. Labs significant for WBC 14.8.  COVID-19 negative.  No initial lactic acid was obtained.  Patient was started on empiric antibiotics of vancomycin, metronidazole, and cefepime which were then de-escalated to cefepime and then Rocephin.  Urology had a long discussion with the family and they prefer not to do MRI for further possible stroke work-up.  For that reason, repeat CT head was done by  neurology which ruled out any stroke.  Assessment & Plan:   Principal Problem:   Sepsis (HCC) Active Problems:   HOCM (hypertrophic obstructive cardiomyopathy) (HCC)   Hypothyroidism   Transient hypotension   Encephalopathy  Acute metabolic encephalopathy vs delirium: Likely multifactorial, could be due to acute infection, aspiration pneumonia versus Haldol.  Patient is doing better today.  He is pleasant, alert and oriented x2.  He is not on oxygen anymore.  He is following commands.  I have discussed with nurses in detail and have advised him to keep his room late during the day from 9 AM to 9 PM and keep it dark from 9 PM to 7 AM and let him rest during nighttime if at all possible.  I will give him a dose of Seroquel tonight to help him sleep.   Possible aspiration pneumonia: chest x-ray suggests no active disease however radiological findings could lag behind clinical findings and he had fever of 100.8 and due to altered mental status, he may have aspirated.  Continue Rocephin for now.  Culture negative.  Possible dysphagia: SLP on board however they have not been able to assess him clearly due to encephalopathy.  Dehydration/decreased urine output: Still with low urine output however has stable and normal BUN/creatinine.  Wonder if [employer] and O's are accurate.  Hypothyroidism: Continue Synthroid.  History of hypertension with transient hypotension: Blood pressure fluctuating but acceptable.  Continue PRN hydralazine.  DVT prophylaxis: Lovenox Code Status: DNR Family Communication: Plan of care discussed with the daughter Gracy Bruins, son Onalee Hua, daughter-in-law Dawn over a conference call.  They told  me that they are very much interested in palliative and possibly hospice care.  Case manager is working on arranging a meeting between them. Disposition Plan: TBD.  Hopefully discharge in next 1 to 2 days.  Consultants:   None  Procedures:   None  Antimicrobials:   Cefepime>  01/06/2019  Rocephin 01/06/2019   Subjective: Patient seen and examined.  He was alert and oriented x2.  He was following commands.  Not on oxygen.  Looked comfortable but still confused.  Objective: Vitals:   01/06/19 2041 01/07/19 0011 01/07/19 0256 01/07/19 0421  BP: (!) 159/79 (!) 161/63 (!) 146/71 (!) 161/56  Pulse: 85 67 81 (!) 57  Resp: 19 17 15  (!) 9  Temp: 98.3 F (36.8 C) 98.4 F (36.9 C) 98 F (36.7 C) 98.6 F (37 C)  TempSrc: Axillary Axillary Oral Axillary  SpO2: 98% 95% 94% 99%  Weight:      Height:        Intake/Output Summary (Last 24 hours) at 01/07/2019 1107 Last data filed at 01/07/2019 0320 Gross per 24 hour  Intake 1578.4 ml  Output --  Net 1578.4 ml   Filed Weights   01/05/19 2308 01/06/19 0348  Weight: 64.9 kg 65.8 kg    Examination:  General exam: Appears calm and comfortable  Respiratory system: Clear to auscultation. Respiratory effort normal. Cardiovascular system: S1 & S2 heard, RRR. No JVD, murmurs, rubs, gallops or clicks. No pedal edema. Gastrointestinal system: Abdomen is nondistended, soft and nontender. No organomegaly or masses felt. Normal bowel sounds heard. Central nervous system: Alert and oriented x2. No focal neurological deficits. Extremities: Symmetric 5 x 5 power. Skin: No rashes, lesions or ulcers.  Psychiatry: Judgement and insight appear poor. Mood & affect flat.   Data Reviewed: I have personally reviewed following labs and imaging studies  CBC: Recent Labs  Lab 01/04/19 1300 01/04/19 1308 01/05/19 0646 01/06/19 0214 01/07/19 0150  WBC 14.8*  --  7.2 6.1 6.2  NEUTROABS 13.3*  --   --  4.3 4.2  HGB 14.3 14.3 14.4 14.2 13.4  HCT 42.9 42.0 44.2 41.3 40.9  MCV 98.6  --  100.2* 98.8 98.6  PLT 218  --  191 186 204   Basic Metabolic Panel: Recent Labs  Lab 01/04/19 1300 01/04/19 1308 01/05/19 0646 01/06/19 0214 01/07/19 0150  NA 136 137 137 139 141  K 3.8 4.1 3.7 3.7 3.6  CL 104 101 106 107 110  CO2 21*   --  21* 20* 20*  GLUCOSE 118* 116* 103* 71 116*  BUN 16 21 19 21 17   CREATININE 0.75 0.60* 0.87 0.83 0.76  CALCIUM 8.7*  --  8.8* 8.6* 8.7*  MG  --   --   --  1.9  --    GFR: Estimated Creatinine Clearance: 47.6 mL/min (by C-G formula based on SCr of 0.76 mg/dL). Liver Function Tests: Recent Labs  Lab 01/04/19 1300 01/06/19 0214 01/07/19 0150  AST 23 25 39  ALT 20 20 21   ALKPHOS 76 67 65  BILITOT 1.3* 1.3* 0.5  PROT 5.9* 5.7* 6.0*  ALBUMIN 3.3* 3.2* 3.3*   No results for input(s): LIPASE, AMYLASE in the last 168 hours. No results for input(s): AMMONIA in the last 168 hours. Coagulation Profile: Recent Labs  Lab 01/04/19 1300  INR 1.0   Cardiac Enzymes: No results for input(s): CKTOTAL, CKMB, CKMBINDEX, TROPONINI in the last 168 hours. BNP (last 3 results) No results for input(s): PROBNP in the last 8760  hours. HbA1C: No results for input(s): HGBA1C in the last 72 hours. CBG: Recent Labs  Lab 01/04/19 1155 01/06/19 0442 01/06/19 0650  GLUCAP 112* 66* 98   Lipid Profile: No results for input(s): CHOL, HDL, LDLCALC, TRIG, CHOLHDL, LDLDIRECT in the last 72 hours. Thyroid Function Tests: Recent Labs    01/05/19 0646  TSH 0.977   Anemia Panel: No results for input(s): VITAMINB12, FOLATE, FERRITIN, TIBC, IRON, RETICCTPCT in the last 72 hours. Sepsis Labs: Recent Labs  Lab 01/04/19 1327 01/04/19 1755 01/04/19 2257  PROCALCITON <0.10  --   --   LATICACIDVEN  --  2.1* 1.4    Recent Results (from the past 240 hour(s))  SARS Coronavirus 2 [facility] order, Performed in [facility] hospital lab) Nasopharyngeal Nasopharyngeal Swab     Status: None   Collection Time: 01/04/19  1:50 PM   Specimen: Nasopharyngeal Swab  Result Value Ref Range Status   SARS Coronavirus 2 NEGATIVE NEGATIVE Final    Comment: (NOTE) If result is NEGATIVE SARS-CoV-2 target nucleic acids are NOT DETECTED. The SARS-CoV-2 RNA is generally detectable in upper and lower  respiratory  specimens during the acute phase of infection. The lowest  concentration of SARS-CoV-2 viral copies this assay can detect is 250  copies / mL. A negative result does not preclude SARS-CoV-2 infection  and should not be used as the sole basis for treatment or other  patient management decisions.  A negative result may occur with  improper specimen collection / handling, submission of specimen other  than nasopharyngeal swab, presence of viral mutation(s) within the  areas targeted by this assay, and inadequate number of viral copies  (<250 copies / mL). A negative result must be combined with clinical  observations, patient history, and epidemiological information. If result is POSITIVE SARS-CoV-2 target nucleic acids are DETECTED. The SARS-CoV-2 RNA is generally detectable in upper and lower  respiratory specimens dur ing the acute phase of infection.  Positive  results are indicative of active infection with SARS-CoV-2.  Clinical  correlation with patient history and other diagnostic information is  necessary to determine patient infection status.  Positive results do  not rule out bacterial infection or co-infection with other viruses. If result is PRESUMPTIVE POSTIVE SARS-CoV-2 nucleic acids MAY BE PRESENT.   A presumptive positive result was obtained on the submitted specimen  and confirmed on repeat testing.  While 2019 novel coronavirus  (SARS-CoV-2) nucleic acids may be present in the submitted sample  additional confirmatory testing may be necessary for epidemiological  and / or clinical management purposes  to differentiate between  SARS-CoV-2 and other Sarbecovirus currently known to infect humans.  If clinically indicated additional testing with an alternate test  methodology [phone]) is advised. The SARS-CoV-2 RNA is generally  detectable in upper and lower respiratory sp ecimens during the acute  phase of infection. The expected result is Negative. Fact Sheet for  Patients:  StrictlyIdeas.no Fact Sheet for Healthcare Providers: BankingDealers.co.za This test is not yet approved or cleared by the [place] FDA and has been authorized for detection and/or diagnosis of SARS-CoV-2 by FDA under an Emergency Use Authorization (EUA).  This EUA will remain in effect (meaning this test can be used) for the duration of the COVID-19 declaration under Section 564(b)(1) of the Act, 21 U.S.C. section 360bbb-3(b)(1), unless the authorization is terminated or revoked sooner. Performed at [facility], [address] [place], [place] [address]   Culture, blood (x 2)  Status: None (Preliminary result)   Collection Time: 01/04/19  5:55 PM   Specimen: BLOOD  Result Value Ref Range Status   Specimen Description BLOOD RIGHT ANTECUBITAL  Final   Special Requests   Final    BOTTLES DRAWN AEROBIC AND ANAEROBIC Blood Culture results may not be optimal due to an inadequate volume of blood received in culture bottles   Culture  Setup Time   Final    GRAM POSITIVE COCCI ANAEROBIC BOTTLE ONLY CRITICAL RESULT CALLED TO, READ BACK BY AND VERIFIED WITH: T. BAUMEISTER, PHARMD AT 1405 ON 01/06/19 BY C. JESSUP, MT.    Culture   Final    GRAM POSITIVE COCCI IDENTIFICATION TO FOLLOW Performed at [facility], [address], [place], [place] [address]    Report Status PENDING  Incomplete  Blood Culture ID Panel (Reflexed)     Status: Abnormal   Collection Time: 01/04/19  5:55 PM  Result Value Ref Range Status   Enterococcus species NOT DETECTED NOT DETECTED Final   Listeria monocytogenes NOT DETECTED NOT DETECTED Final   Staphylococcus species DETECTED (A) NOT DETECTED Final    Comment: Methicillin (oxacillin) susceptible coagulase negative staphylococcus. Possible blood culture contaminant (unless isolated from more than one blood culture draw or clinical case suggests pathogenicity). No antibiotic treatment is  indicated for blood  culture contaminants. CRITICAL RESULT CALLED TO, READ BACK BY AND VERIFIED WITH: T. BAUMEISTER, PHARMD AT 1405 ON 01/06/19 BY C. JESSUP, MT.    Staphylococcus aureus (BCID) NOT DETECTED NOT DETECTED Final   Methicillin resistance NOT DETECTED NOT DETECTED Final   Streptococcus species NOT DETECTED NOT DETECTED Final   Streptococcus agalactiae NOT DETECTED NOT DETECTED Final   Streptococcus pneumoniae NOT DETECTED NOT DETECTED Final   Streptococcus pyogenes NOT DETECTED NOT DETECTED Final   Acinetobacter baumannii NOT DETECTED NOT DETECTED Final   Enterobacteriaceae species NOT DETECTED NOT DETECTED Final   Enterobacter cloacae complex NOT DETECTED NOT DETECTED Final   Escherichia coli NOT DETECTED NOT DETECTED Final   Klebsiella oxytoca NOT DETECTED NOT DETECTED Final   Klebsiella pneumoniae NOT DETECTED NOT DETECTED Final   Proteus species NOT DETECTED NOT DETECTED Final   Serratia marcescens NOT DETECTED NOT DETECTED Final   Haemophilus influenzae NOT DETECTED NOT DETECTED Final   Neisseria meningitidis NOT DETECTED NOT DETECTED Final   Pseudomonas aeruginosa NOT DETECTED NOT DETECTED Final   Candida albicans NOT DETECTED NOT DETECTED Final   Candida glabrata NOT DETECTED NOT DETECTED Final   Candida krusei NOT DETECTED NOT DETECTED Final   Candida parapsilosis NOT DETECTED NOT DETECTED Final   Candida tropicalis NOT DETECTED NOT DETECTED Final    Comment: Performed at [facility], [address], [place], [place] [address]  Culture, blood (x 2)     Status: None (Preliminary result)   Collection Time: 01/04/19 10:27 PM   Specimen: BLOOD  Result Value Ref Range Status   Specimen Description BLOOD RIGHT ANTECUBITAL  Final   Special Requests   Final    BOTTLES DRAWN AEROBIC AND ANAEROBIC Blood Culture adequate volume   Culture   Final    NO GROWTH 2 DAYS Performed at [facility], [address], [place], [place] [address]    Report Status  PENDING  Incomplete  MRSA PCR Screening     Status: None   Collection Time: 01/05/19  4:28 PM   Specimen: Nasal Mucosa; Nasopharyngeal  Result Value Ref Range Status   MRSA by PCR NEGATIVE  NEGATIVE Final    Comment:        The GeneXpert MRSA Assay (FDA approved for NASAL specimens only), is one component of a comprehensive MRSA colonization surveillance program. It is not intended to diagnose MRSA infection nor to guide or monitor treatment for MRSA infections. Performed at [facility], [address] [place], [place] [address]       Radiology Studies: Ct Head Wo Contrast  Result Date: 01/06/2019 CLINICAL DATA:  Stroke, follow-up EXAM: CT HEAD WITHOUT CONTRAST TECHNIQUE: Contiguous axial images were obtained from the base of the skull through the vertex without intravenous contrast. COMPARISON:  Head CT 01/04/2019 FINDINGS: Brain: There is no acute intracranial hemorrhage. No demarcated cortical infarction. No evidence of intracranial mass. No midline shift or extra-axial fluid collection. Redemonstrated chronic right basal ganglia lacunar infarct. Ill-defined hypoattenuation of the cerebral white matter is nonspecific, but consistent with chronic small vessel ischemic disease. Moderate generalized parenchymal atrophy. Vascular: No hyperdense vessel Skull: No calvarial fracture. Sinuses/Orbits: Imaged globes and orbits demonstrate no acute abnormality. Trace ethmoid sinus mucosal thickening. No significant mastoid effusion IMPRESSION: No CT evidence of acute infarct. Consider brain MRI for further evaluation, as clinically warranted. Chronic small vessel ischemic disease with redemonstrated chronic right basal ganglia lacunar infarct. Moderate generalized atrophy. Electronically Signed   By: Jackey LogeKyle  Golden   On: 01/06/2019 15:15    Scheduled Meds:  acetaminophen (TYLENOL) oral liquid 160 mg/5 mL  650 mg Oral Once   enoxaparin (LOVENOX) injection  40 mg Subcutaneous Q24H    finasteride  5 mg Oral QHS   levothyroxine  25 mcg Intravenous Daily   mouth rinse  15 mL Mouth Rinse BID   metoprolol tartrate  2.5 mg Intravenous Q8H   pantoprazole (PROTONIX) IV  40 mg Intravenous Q24H   QUEtiapine  25 mg Oral QHS   sodium chloride flush  3 mL Intravenous Q12H   Continuous Infusions:  cefTRIAXone (ROCEPHIN)  IV Stopped (01/06/19 1628)   dextrose 5 % and 0.9 % NaCl with KCl 40 mEq/L 100 mL/hr at 01/07/19 1058     LOS: 3 days   Time spent: 43 minutes which includes time spent on family conference.   Hughie Clossavi Birttany Dechellis, MD [facility] Pager [phone]  If 7PM-7AM, please contact night-coverage [email or web address] Password [email or web address] 01/07/2019, 11:07 AM

## 2019-01-07 NOTE — TOC Initial Note (Signed)
Transition of Care [facility]) - Initial/Assessment Note    Patient Details  Name: Casey Andrade MRN: [id number] Date of Birth: Jan 26, 1920  [facility] CM/SW Contact:    Sharin Mons, RN Phone Number: [phone] 01/07/2019, 12:36 PM  Clinical Narrative:             Admitted with sepsis, hx of  hypertension, hypertrophic cardiomyopathy last EF 65 to 70%, dementia, hypothyroidism, hyponatremia, and gait disturbance. From home with wife who was recently placed in [facility] on Monday 9/14. Son Shanon Brow [phone]) states the plan was for dad to be placed as well on Monday, however, presented to the hospital.   Palliative consult in place : [place], pending. NCM awaiting evaluation. Family's preference ... hoping pt will d/c to ALF with wife under hospice care. Son Shanon Brow to meet with palliative liaison today.  NCM will continue to monitor for TOC needs.....    Barriers to Discharge: Continued Medical Work up   Patient Goals and CMS Choice        Expected Discharge Plan and Services     Discharge Planning Services: CM Consult                                          Prior Living Arrangements/Services                       Activities of Daily Living Home Assistive Devices/Equipment: None ADL Screening (condition at time of admission) Patient's cognitive ability adequate to safely complete daily activities?: No Is the patient deaf or have difficulty hearing?: No Does the patient have difficulty seeing, even when wearing glasses/contacts?: No Does the patient have difficulty concentrating, remembering, or making decisions?: Yes(level 2 dementia per daughter) Patient able to express need for assistance with ADLs?: No Does the patient have difficulty dressing or bathing?: Yes Independently performs ADLs?: No Communication: Independent Dressing (OT): Dependent Is this a change from baseline?: Pre-admission baseline Grooming: Dependent Is  this a change from baseline?: Pre-admission baseline Feeding: Dependent Is this a change from baseline?: Pre-admission baseline Bathing: Dependent Is this a change from baseline?: Pre-admission baseline Toileting: Dependent Is this a change from baseline?: Pre-admission baseline In/Out Bed: Dependent Is this a change from baseline?: Pre-admission baseline Does the patient have difficulty walking or climbing stairs?: No Weakness of Legs: Both Weakness of Arms/Hands: Both  Permission Sought/Granted                  Emotional Assessment              Admission diagnosis:  Altered mental status, unspecified altered mental status type [R41.82] Sepsis ([place]) [A41.9] Patient Active Problem List   Diagnosis Date Noted  . Sepsis ([place]) 01/04/2019  . Transient hypotension 01/04/2019  . Encephalopathy 01/04/2019  . Mitral valvular regurgitation 08/17/2016  . HOCM (hypertrophic obstructive cardiomyopathy) ([place]) 09/14/2010  . RBBB (right bundle branch block) 09/14/2010  . Hypothyroidism 09/14/2010  . Benign hypertensive heart disease without heart failure 09/14/2010   PCP:  Hulan Fess, MD Pharmacy:  No Pharmacies Listed    Social Determinants of Health (SDOH) Interventions    Readmission Risk Interventions No flowsheet data found.

## 2019-01-07 NOTE — Care Management Important Message (Signed)
Important Message  Patient Details  Name: Casey Andrade MRN: [id number] Date of Birth: 07-02-1919   Medicare Important Message Given:  Yes     Faryn Sieg Montine Circle 01/07/2019, 12:42 PM

## 2019-01-07 NOTE — Progress Notes (Signed)
  Speech Language Pathology Treatment: Dysphagia  Patient Details Name: Casey Andrade MRN: [id number] DOB: [birth year] Today's Date: 01/07/2019 Time: 2025-4270 SLP Time Calculation (min) (ACUTE ONLY): 20 min  Assessment / Plan / Recommendation Clinical Impression  Patient seen to determine readiness for PO/instrumental. He was awake, pleasant and cooperative. Trials of ice chips, water, puree and cracker were given. Ice chips, puree and cracker tolerated well with good oral manipulation and bolus control. Oral cavity clear after swallow. Suspect pharyngeal dysphagia based on throat clearing and wet vocal quality after cup sips of all quantities. Recommend a MBS to further evaluate swallowing function. RN notified. Continue NPO status until after MBS.   HPI HPI: Casey Andrade is a 85 y.o. male with medical history significant of hypertension, hypertrophic cardiomyopathy last EF 65 to 70%, dementia, hypothyroidism, hyponatremia, and gait disturbance; who presents after being found acutely altered.  Pt's daughter reported that pt has a baseline productive cough, but sputum production has increased in the last week.  Pt initially appeared to be flaccid on L side; however, head CT was clear.  CXR on  9/14 was unremarkable.        SLP Plan  MBS       Recommendations  Diet recommendations: NPO Medication Administration: Via alternative means                SLP Visit Diagnosis: Dysphagia, unspecified (R13.10) Plan: MBS       GO                Charlynne Cousins Silvanna Ohmer, MA, CCC-SLP 01/07/2019 1:11 PM

## 2019-01-07 NOTE — Progress Notes (Signed)
Modified Barium Swallow Progress Note  Patient Details  Name: Casey Andrade MRN: [id number] Date of Birth: 03-17-1920  Today's Date: 01/07/2019  Modified Barium Swallow completed.  Full report located under Chart Review in the Imaging Section.  Brief recommendations include the following:  Clinical Impression  (P) Patient presents with a moderate pharyngeal dysphagia. Oral phase was grossly normal with complete mastication of solids, good bolus control and complete oral clearance. A delayed swallow initiation to the valleculae with nectar thick, pudding and cracker and to pyriforms with thin liquids. Incomplete laryngeal anterior movement and closure across all consistencies. Laryngeal penetration/aspiration with thin liquids of any quantity. Patient did not initiate a throat clear or cough and was uanble to clear with a cued cough. Recommend a Dys 3, nectar thickened liquid diet. Medications given whole in puree. ST to follow for diet tolerance, upgrades and aspiration precaution training.    Swallow Evaluation Recommendations       SLP Diet Recommendations: (P) Dysphagia 3 (Mech soft) solids;Nectar thick liquid   Liquid Administration via: (P) Cup   Medication Administration: (P) Whole meds with puree   Supervision: (P) Full assist for feeding   Compensations: (P) Slow rate;Small sips/bites;Minimize environmental distractions   Postural Changes: (P) Seated upright at 90 degrees   Oral Care Recommendations: (P) Oral care BID   Other Recommendations: (P) Order thickener from Jay, MA, CCC-SLP 01/07/2019 1:56 PM

## 2019-01-07 NOTE — Progress Notes (Signed)
Hydrologist [facility])  Hospital Liaison: RN note    Notified by Whitman Hero, CMRN of patient/family request for [facility] services at home in ALF after discharge. Chart and patient information under review by [facility] physician. Hospice eligibility pending currently.     Writer spoke with  Hulan Fess and his wife Butch Penny to initiate education related to hospice philosophy, services and team approach to care. Family verbalized understanding of information given. Currently family is waiting on PMT consult to discuss goals of care and best placement for patient after discharge. The family would like for the patient to go to [facility] to be with his wife and receive [facility] hospice services.   [facility] will follow to determine what the discharge plan will be where the patient will go. Once this is determined [facility] can arrange for hospice services.     DME needs have not been discussed. Please advise if [facility] needs to order DME prior to discharge.   If this patient does go to [facility] he will be followed by the [facility] team. If you have any questions please reach out to the liaison on AMION or call [phone].     Please call with any hospice related questions.     Thank you for this referral.     Farrel Gordon, RN, CCM  [place] (listed on AMION under [facility] [place])  [phone]

## 2019-01-07 NOTE — Consult Note (Signed)
Consultation Note Date: 01/07/2019   Patient Name: Casey Andrade  DOB: November 13, 1919  MRN: [id number]  Age / Sex: 85 y.o., male  PCP: Hulan Fess, MD Referring Physician: Darliss Cheney, MD  Reason for Consultation: Establishing goals of care  HPI/Patient Profile: 85 y.o. male  with past medical history of HOCM, RBBB, dementia, chronic hyponatremia,  who was admitted on 01/04/2019 with AMS and a flaccid left side.  On admission he had a fever of 100.8 rectally which was felt to be caused by aspiration pneumonia.  Unfortunately Mr. Buhl had been suffering with agitation.  He received a dose of haldol and a dose of ativan.  He is sensitive to these medications and they had a strong reaction causing him to oversleep and be lethargic.   Clinical Assessment and Goals of Care:  I have reviewed medical records including EPIC notes, labs and imaging, received report from the care team assessed the patient and then met at the bedside along with his four children a DIL and SIL  to discuss diagnosis prognosis, GOC, EOL wishes, disposition and options.  I introduced Palliative Medicine as specialized medical care for people living with serious illness. It focuses on providing relief from the symptoms and stress of a serious illness. The goal is to improve quality of life for both the patient and the family.  We discussed a brief life review of the patient. Mr. Trettel lives at home with is 54 year old wife.  Their daughters who live locally help care for them in the home.  Over the last 3 months he has suffered from increased falls.  Home health was brought in but unfortunately Mrs. Mcquiston would refuse their help.  Recently Mrs. Wolgamott had a hospitalization.  This caused Mr. Tripodi to end up at [facility] where he began having episodes of delirium (Thinking he was caught in a fire).  We discussed his current illness and what it  means in the larger context of his on-going co-morbidities.  Natural disease trajectory and expectations at EOL were discussed.  Specifically we discussed aspiration pneumonia and the idea of careful had feeding rather than considering a PEG tube in this nice gentleman with dementia.  We also discussed his cardiac status, recent falls and delirium.     Family was concerned about medications including hadol, ativan, and seroquel.    Family concluded that they wanted their parents to be together at [facility] ALF with support from [facility]).  They would also life for Mr. Beckstead to be evaluated by physical therapy.  When we returned to Mr. Grisanti's room he was once again suffering with delirium.  He was attempting to get out of bed thinking the building was on fire.  His family chooses to use non-medicinal methods of treating delirium including distraction and re-direction.  I discussed a trial of low dose Zyprexa with the family and they were in support of the trial.  Questions and concerns were addressed.  Hard Choices booklet left for review. The family was  encouraged to call with questions or concerns.    Primary Decision Maker:  HCPOA dtr Joy. However, family appears to make decisions as a whole.      SUMMARY OF RECOMMENDATIONS    Will add delirium precautions  Family supportive of a trial of zyprexa for acute delirium  Will dc seroquel so that he is not over sedated.  Family would like Hospice services at [facility] ALF on [place].  PMT will follow up 9/18   Code Status/Advance Care Planning:  DNR  Symptom Management:   As above.  Additional Recommendations (Limitations, Scope, Preferences):  Avoid Hospitalization and Minimize Medications  Palliative Prophylaxis:   Delirium Protocol  Psycho-social/Spiritual:   Desire for further Chaplaincy support:  Not discussed.  Prognosis:   Less than 6 months given multiple falls, recurrent delirium, aspiration  pneumonia, cardiomyopathy with nonsustained Vtach, and advanced age.    Discharge Planning: ALF with Hospice      Primary Diagnoses: Present on Admission: . Sepsis ([place]) . Hypothyroidism . HOCM (hypertrophic obstructive cardiomyopathy) ([place]) . Transient hypotension . Encephalopathy   I have reviewed the medical record, interviewed the patient and family, and examined the patient. The following aspects are pertinent.  Past Medical History:  Diagnosis Date  . Dyspnea    increasing  . Essential hypertension   . History of echocardiogram 04/27/2010   EF was in the range of 65-70% / Mitral valve, systolic anterior motion of the anterior mitral chordae               . Hypertension   . Hypertr obst cardiomyop   . Hyponatremia    for which nephrology is seeing him  . Hypothyroidism   . 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 Murmur    noted to have loud murmur  . Right bundle branch block   . Weakness    chronic weakness without syncope   Social History   Socioeconomic History  . Marital status: Single    Spouse name: Not on file  . Number of children: Not on file  . Years of education: Not on file  . Highest education level: Not on file  Occupational History  . Not on file  Social Needs  . Financial resource strain: Not on file  . Food insecurity    Worry: Not on file    Inability: Not on file  . Transportation needs    Medical: Not on file    Non-medical: Not on file  Tobacco Use  . Smoking status: Never Smoker  . Smokeless tobacco: Never Used  Substance and Sexual Activity  . Alcohol use: No  . Drug use: No  . Sexual activity: Not on file  Lifestyle  . Physical activity    Days per week: Not on file    Minutes per session: Not on file  . Stress: Not on file  Relationships  . Social Herbalist on phone: Not on file    Gets together: Not on file    Attends  religious service: Not on file    Active member of club or organization: Not on file    Attends meetings of clubs or organizations: Not on file    Relationship status: Not on file  Other Topics Concern  . Not on file  Social History Narrative   Patient is married, he has a son-in-law who is a Film/video editor in [place]  Family History  Problem Relation Age of Onset  . Diabetes Sister    Scheduled Meds: . acetaminophen (TYLENOL) oral liquid 160 mg/5 mL  650 mg Oral Once  . enoxaparin (LOVENOX) injection  40 mg Subcutaneous Q24H  . finasteride  5 mg Oral QHS  . levothyroxine  25 mcg Intravenous Daily  . mouth rinse  15 mL Mouth Rinse BID  . metoprolol tartrate  2.5 mg Intravenous Q8H  . OLANZapine zydis  2.5 mg Oral QHS  . pantoprazole (PROTONIX) IV  40 mg Intravenous Q24H  . sodium chloride flush  3 mL Intravenous Q12H   Continuous Infusions: . dextrose 5 % and 0.9 % NaCl with KCl 40 mEq/L 100 mL/hr at 01/07/19 1058   PRN Meds:.acetaminophen **OR** acetaminophen, albuterol, hydrALAZINE, ondansetron **OR** ondansetron (ZOFRAN) IV, Resource ThickenUp Clear Allergies  Allergen Reactions  . Augmentin [Amoxicillin-Pot Clavulanate]     Doesn't recall  . Hydrocodone Bitartrate [Hydrocodone]     Doesn't recall  . Neosporin [Bacitracin-Polymyxin B] Other (See Comments)    unknown   Review of Systems unable to provide due to dementia.  Physical Exam  Friendly elderly gentleman, pleasantly demented, awake, alert CV rrr resp no distress Abdomen thin, soft, nt, nd  Vital Signs: BP (!) 183/79   Pulse 61   Temp 98.2 F (36.8 C) (Oral)   Resp 16   Ht 5' 6"  (1.676 m)   Wt 65.8 kg   SpO2 98%   BMI 23.41 kg/m  Pain Scale: 0-10   Pain Score: 0-No pain   SpO2: SpO2: 98 % O2 Device:SpO2: 98 % O2 Flow Rate: .O2 Flow Rate (L/min): 2 L/min  IO: Intake/output summary:   Intake/Output Summary (Last 24 hours) at 01/07/2019 2049 Last data filed at 01/07/2019 1516 Gross per 24  hour  Intake 2651.38 ml  Output -  Net 2651.38 ml    LBM: Last BM Date: (PTA) Baseline Weight: Weight: 64.9 kg Most recent weight: Weight: 65.8 kg     Palliative Assessment/Data: 20%     Time In: 4:00 Time Out: 5:30 Time Total: 90 min Visit consisted of counseling and education dealing with the complex and emotionally intense issues surrounding the need for palliative care and symptom management in the setting of serious and potentially life-threatening illness. Greater than 50%  of this time was spent counseling and coordinating care related to the above assessment and plan.  Signed by: Florentina Jenny, PA-C Palliative Medicine Pager: [phone]  Please contact Palliative Medicine Team phone at [phone] for questions and concerns.  For individual provider: See Shea Evans

## 2019-01-08 DIAGNOSIS — G7281 Critical illness myopathy: Secondary | ICD-10-CM

## 2019-01-08 DIAGNOSIS — A021 Salmonella sepsis: Secondary | ICD-10-CM

## 2019-01-08 DIAGNOSIS — R6520 Severe sepsis without septic shock: Secondary | ICD-10-CM

## 2019-01-08 LAB — CULTURE, BLOOD (ROUTINE X 2)

## 2019-01-08 LAB — COMPREHENSIVE METABOLIC PANEL
ALT: 21 U/L (ref 0–44)
AST: 37 U/L (ref 15–41)
Albumin: 3.2 g/dL — ABNORMAL LOW (ref 3.5–5.0)
Alkaline Phosphatase: 63 U/L (ref 38–126)
Anion gap: 9 (ref 5–15)
BUN: 11 mg/dL (ref 8–23)
CO2: 21 mmol/L — ABNORMAL LOW (ref 22–32)
Calcium: 8.4 mg/dL — ABNORMAL LOW (ref 8.9–10.3)
Chloride: 110 mmol/L (ref 98–111)
Creatinine, Ser: 0.79 mg/dL (ref 0.61–1.24)
GFR calc Af Amer: >60 mL/min (ref >60)
GFR calc non Af Amer: >60 mL/min (ref >60)
Glucose, Bld: 98 mg/dL (ref 70–99)
Potassium: 3.9 mmol/L (ref 3.5–5.1)
Sodium: 140 mmol/L (ref 135–145)
Total Bilirubin: 0.5 mg/dL (ref 0.3–1.2)
Total Protein: 5.7 g/dL — ABNORMAL LOW (ref 6.5–8.1)

## 2019-01-08 LAB — CBC WITH DIFFERENTIAL/PLATELET
Abs Immature Granulocytes: 0.01 10*3/uL (ref 0.00–0.07)
Basophils Absolute: 0.1 10*3/uL (ref 0.0–0.1)
Basophils Relative: 1 %
Eosinophils Absolute: 0.2 10*3/uL (ref 0.0–0.5)
Eosinophils Relative: 4 %
HCT: 42.7 % (ref 39.0–52.0)
Hemoglobin: 13.9 g/dL (ref 13.0–17.0)
Immature Granulocytes: 0 %
Lymphocytes Relative: 23 %
Lymphs Abs: 1.3 10*3/uL (ref 0.7–4.0)
MCH: 32.9 pg (ref 26.0–34.0)
MCHC: 32.6 g/dL (ref 30.0–36.0)
MCV: 101.2 fL — ABNORMAL HIGH (ref 80.0–100.0)
Monocytes Absolute: 0.6 10*3/uL (ref 0.1–1.0)
Monocytes Relative: 11 %
Neutro Abs: 3.5 10*3/uL (ref 1.7–7.7)
Neutrophils Relative %: 61 %
Platelets: 187 10*3/uL (ref 150–400)
RBC: 4.22 MIL/uL (ref 4.22–5.81)
RDW: 12.9 % (ref 11.5–15.5)
WBC: 5.6 10*3/uL (ref 4.0–10.5)
nRBC: 0 % (ref 0.0–0.2)

## 2019-01-08 LAB — GLUCOSE, CAPILLARY: Glucose-Capillary: 113 mg/dL — ABNORMAL HIGH (ref 70–99)

## 2019-01-08 MED ORDER — SODIUM CHLORIDE 0.9 % IV SOLN
2.0000 g | INTRAVENOUS | Status: AC
  Administered 2019-01-09 – 2019-01-10 (×2): 2 g via INTRAVENOUS
  Filled 2019-01-08 (×2): qty 20

## 2019-01-08 NOTE — Plan of Care (Deleted)

## 2019-01-08 NOTE — Plan of Care (Signed)
  Problem: Clinical Measurements: Goal: Ability to maintain clinical measurements within normal limits will improve 01/08/2019 1545 by Toni Amend, RN Outcome: Progressing 01/08/2019 1543 by Toni Amend, RN Outcome: Progressing Goal: Will remain free from infection 01/08/2019 1545 by Toni Amend, RN Outcome: Progressing 01/08/2019 1543 by Toni Amend, RN Outcome: Progressing Goal: Respiratory complications will improve 01/08/2019 1545 by Toni Amend, RN Outcome: Progressing 01/08/2019 1543 by Toni Amend, RN Outcome: Progressing Goal: Cardiovascular complication will be avoided 01/08/2019 1545 by Toni Amend, RN Outcome: Progressing 01/08/2019 1543 by Toni Amend, RN Outcome: Progressing   Problem: Nutrition: Goal: Adequate nutrition will be maintained 01/08/2019 1545 by Toni Amend, RN Outcome: Progressing 01/08/2019 1543 by Toni Amend, RN Outcome: Progressing   Problem: Coping: Goal: Level of anxiety will decrease 01/08/2019 1545 by Toni Amend, RN Outcome: Progressing 01/08/2019 1543 by Toni Amend, RN Outcome: Progressing   Problem: Elimination: Goal: Will not experience complications related to bowel motility 01/08/2019 1545 by Toni Amend, RN Outcome: Progressing 01/08/2019 1543 by Toni Amend, RN Outcome: Progressing Goal: Will not experience complications related to urinary retention 01/08/2019 1545 by Toni Amend, RN Outcome: Progressing 01/08/2019 1543 by Toni Amend, RN Outcome: Progressing   Problem: Pain Managment: Goal: General experience of comfort will improve 01/08/2019 1545 by Toni Amend, RN Outcome: Progressing 01/08/2019 1543 by Toni Amend, RN Outcome: Progressing   Problem: Safety: Goal: Ability to remain free from injury will improve 01/08/2019 1545 by Toni Amend, RN Outcome: Progressing 01/08/2019 1543 by Toni Amend, RN Outcome:  Progressing   Problem: Skin Integrity: Goal: Risk for impaired skin integrity will decrease 01/08/2019 1545 by Toni Amend, RN Outcome: Progressing 01/08/2019 1543 by Toni Amend, RN Outcome: Progressing

## 2019-01-08 NOTE — Progress Notes (Signed)
   01/07/19 2150  MEWS Score  Pulse Rate 79  BP (!) 136/94  Temp 99.1 F (37.3 C)  SpO2 96 %  O2 Device Room Air  MEWS Score  MEWS RR 0  MEWS Pulse 0  MEWS Systolic 0  MEWS LOC 0  MEWS Temp 0  MEWS Score 0  MEWS Score Color Green  MEWS Assessment  Is this an acute change? No  MEWS Guidelines - (patients age 83 and over)  Red - At High Risk for Deterioration Yellow - At risk for Deterioration  1. Go to room and assess patient 2. Validate data. Is this patient's baseline? If data confirmed: 3. Is this an acute change? 4. Administer prn meds/treatments as ordered. 5. Note Sepsis score 6. Review goals of care 7. Sports coach, RRT nurse and Provider. 8. Ask Provider to come to bedside.  9. Document patient condition/interventions/response. 10. Increase frequency of vital signs and focused assessments to at least q15 minutes x 4, then q30 minutes x2. - If stable, then q1h x3, then q4h x3 and then q8h or dept. routine. - If unstable, contact Provider & RRT nurse. Prepare for possible transfer. 11. Add entry in progress notes using the smart phrase ".MEWS". 1. Go to room and assess patient 2. Validate data. Is this patient's baseline? If data confirmed: 3. Is this an acute change? 4. Administer prn meds/treatments as ordered? 5. Note Sepsis score 6. Review goals of care 7. Sports coach and Provider 8. Call RRT nurse as needed. 9. Document patient condition/interventions/response. 10. Increase frequency of vital signs and focused assessments to at least q2h x2. - If stable, then q4h x2 and then q8h or dept. routine. - If unstable, contact Provider & RRT nurse. Prepare for possible transfer. 11. Add entry in progress notes using the smart phrase ".MEWS".  Green - Likely stable [place] - Comfort Care Only  1. Continue routine/ordered monitoring.  2. Review goals of care. 1. Continue routine/ordered monitoring. 2. Review goals of care.

## 2019-01-08 NOTE — Progress Notes (Signed)
Daily Progress Note   Patient Name: Casey Andrade       Date: 01/08/2019 DOB: November 14, 1919  Age: 85 y.o. MRN#: [id number] Attending Physician: Darliss Cheney, MD Primary Care Physician: Hulan Fess, MD Admit Date: 01/04/2019  Reason for Consultation/Follow-up: Establishing goals of care  Spoke with RN this am at 6:00.  Patient's night was OK, he pulled off tele leads and Condom cath and was active, but did not have and behavioral disruptions.    I asked the night RN to d/c tele.  Subjective: Patient still with delirium but able to easily distract and redirect.  He asks for me to help him "get thru that door please.  I have to see my mother in law".  He tells me he is cold and doesn't have any clothes on.  I turned up his heat and added and extra blanket. I attempted to call his wife so that he could hear her voice on speaker phone but there was no answer.  I have spoken with his daughter Casey Andrade.  She and Casey Andrade are taking turns supporting him in the hospital.  He does much better with famiyl in the room.  I suggested to her that his delirium would improve if he was at home [facility]) with his wife Casey Andrade.  She mentioned her siblings needed to consider that and ensure that [facility] was ready for him (had resources in place).  She would discuss it with her siblings.  She mentioned that he has been having some delirium at home with his wife prior to [name].  This has been going on for weeks (at least).   Assessment: Patient awake, alert, having active delirium but easily to distract and redirect.  Otherwise he appears stable.  Ate well yesterday but did cough after eating.     Patient Profile/HPI:  85 y.o. male  with past medical history of HOCM, RBBB, dementia, chronic hyponatremia,   who was admitted on 01/04/2019 with AMS and a flaccid left side.  On admission he had a fever of 100.8 rectally which was felt to be caused by aspiration pneumonia.  Unfortunately [name] had been suffering with agitation.  He received a dose of haldol and a dose of ativan.  He is sensitive to these medications and they had a strong reaction causing him  to oversleep and be lethargic.      Length of Stay: 4  Current Medications: Scheduled Meds:  . enoxaparin (LOVENOX) injection  40 mg Subcutaneous Q24H  . finasteride  5 mg Oral QHS  . levothyroxine  25 mcg Intravenous Daily  . mouth rinse  15 mL Mouth Rinse BID  . metoprolol tartrate  2.5 mg Intravenous Q8H  . OLANZapine zydis  2.5 mg Oral QHS  . pantoprazole (PROTONIX) IV  40 mg Intravenous Q24H  . sodium chloride flush  3 mL Intravenous Q12H    Continuous Infusions: . dextrose 5 % and 0.9 % NaCl with KCl 40 mEq/L 100 mL/hr at 01/07/19 2136    PRN Meds: acetaminophen **OR** acetaminophen, albuterol, hydrALAZINE, ondansetron **OR** ondansetron (ZOFRAN) IV, Resource ThickenUp Clear  Physical Exam      Thin frail elderly male, awake, alert, demented. CV rrr resp no distress Abdomen soft, thin, nt, nd  Vital Signs: BP (!) 195/95 (BP Location: Right Arm)   Pulse 79   Temp 98.2 F (36.8 C) (Oral)   Resp 16   Ht 5\' 6"  (1.676 m)   Wt 65.8 kg   SpO2 96%   BMI 23.41 kg/m  SpO2: SpO2: 96 % O2 Device: O2 Device: Room Air O2 Flow Rate: O2 Flow Rate (L/min): 2 L/min  Intake/output summary:   Intake/Output Summary (Last 24 hours) at 01/08/2019 1110 Last data filed at 01/08/2019 [id number] Gross per 24 hour  Intake 2285.04 ml  Output -  Net 2285.04 ml   LBM: Last BM Date: (PTA) Baseline Weight: Weight: 64.9 kg Most recent weight: Weight: 65.8 kg       Palliative Assessment/Data:  30%      Patient Active Problem List   Diagnosis Date Noted  . Altered mental status   . Dementia associated with other underlying disease  without behavioral disturbance (HCC)   . Acute delirium   . Palliative care encounter   . Sepsis (HCC) 01/04/2019  . Transient hypotension 01/04/2019  . Encephalopathy 01/04/2019  . Mitral valvular regurgitation 08/17/2016  . HOCM (hypertrophic obstructive cardiomyopathy) (HCC) 09/14/2010  . RBBB (right bundle branch block) 09/14/2010  . Hypothyroidism 09/14/2010  . Benign hypertensive heart disease without heart failure 09/14/2010    Palliative Care Plan    Recommendations/Plan:  Please continue Zyprexa 2.5 mg qhs for delirium.  It usually helps significantly after 48 hours.    When patient and family are ready for discharge [facility].  Palliative will monitor from a distance please call our office if we are needed over the weekend.  [phone]  Goals of Care and Additional Recommendations: Limitations on Scope of Treatment:Treat the treatable with minimally invasive measures.    Code Status:  DNR  Prognosis:  Weeks to months given on-going delirium, aspiration, dementia and advanced age.  Discharge Planning:  [facility]. When appropriate.  Care plan was discussed with family and Charge RN  Thank you for allowing the Palliative Medicine Team to assist in the care of this patient.  Total time spent:  35 min.     Greater than 50%  of this time was spent counseling and coordinating care related to the above assessment and plan.  Norvel RichardsMarianne Zyairah Wacha, PA-C Palliative Medicine  Please contact Palliative MedicineTeam phone at [phone] for questions and concerns between 7 am - 7 pm.   Please see AMION for individual provider pager numbers.

## 2019-01-08 NOTE — Plan of Care (Signed)
  Problem: Education: Goal: Knowledge of General Education information will improve Description: Including pain rating scale, medication(s)/side effects and non-pharmacologic comfort measures Outcome: Not Progressing Note: Confusion   Problem: Health Behavior/Discharge Planning: Goal: Ability to manage health-related needs will improve Outcome: Not Progressing Note: Confusion/immobility   Problem: Clinical Measurements: Goal: Ability to maintain clinical measurements within normal limits will improve Outcome: Progressing Goal: Will remain free from infection Outcome: Progressing Goal: Diagnostic test results will improve Outcome: Progressing Goal: Respiratory complications will improve Outcome: Progressing Goal: Cardiovascular complication will be avoided Outcome: Progressing   Problem: Activity: Goal: Risk for activity intolerance will decrease Outcome: Not Progressing Note: Immobility   Problem: Nutrition: Goal: Adequate nutrition will be maintained Outcome: Not Progressing Note: Aspiration risk   Problem: Coping: Goal: Level of anxiety will decrease Outcome: Progressing   Problem: Elimination: Goal: Will not experience complications related to bowel motility Outcome: Progressing Goal: Will not experience complications related to urinary retention Outcome: Progressing   Problem: Pain Managment: Goal: General experience of comfort will improve Outcome: Progressing   Problem: Safety: Goal: Ability to remain free from injury will improve Outcome: Not Progressing Note: Confusion   Problem: Skin Integrity: Goal: Risk for impaired skin integrity will decrease Outcome: Not Progressing Note: Immobility

## 2019-01-08 NOTE — Evaluation (Signed)
Physical Therapy Evaluation Patient Details Name: Casey Andrade MRN: [id number] DOB: 1920-02-18 Today's Date: 01/08/2019   History of Present Illness  85yo male with AMS, unresponsive and not following commands, lethargic. BP 97/45 in ED, L UE flaccidity noted but imaging negative for CVA. Admitted for SIRS/sepsis, transient hypotension, and acute encephalopathy. PMH HTN hypothyroidism, R bundle branch block  Clinical Impression   Patient received in bed, son Shanon Brow present and assisted with communicating with patient during session and also in management of seat flaps on stedy/IV pole during session. Patient required Coleman for functional bed mobility, and heavy MaxA for multiple attempts at standing first in RW, then transitioned to stedy for safety and for transfer. Heavy MaxA to stand in stedy due to strong posterior lean, then totalA to transfer to chair in stedy, also MaxA to stand from elevated surface and to sit down in chair. He was left in the chair with chair alarm active and all needs met, son Shanon Brow present to assist in eating meal. Per what David/spouse report today, patient needs to be able to perform mobility with assist of +2 to meet [facility]'s level 3 mobility requirement for admission- based on information gleaned from today's eval, he should certainly be able to transfer with the assistance of 2 staff members at that facility. He will continue to benefit from skilled PT services in the acute setting, recommend [facility] with in-house therapy services (as long as hospice allows/approves).     Follow Up Recommendations Other (comment);Supervision/Assistance - 24 hour([facility] with in house skilled therapy services (if Hospice allows/approves))    Equipment Recommendations  Other (comment)(defer to next venue)    Recommendations for Other Services       Precautions / Restrictions Precautions Precautions: Fall;Other (comment) Precaution Comments: confusion/delerium-  does much better when family is there Restrictions Weight Bearing Restrictions: No      Mobility  Bed Mobility Overal bed mobility: Needs Assistance Bed Mobility: Supine to Sit     Supine to sit: Mod assist     General bed mobility comments: ModA to bring LEs over the side of the bed, extended time  Transfers Overall transfer level: Needs assistance   Transfers: Sit to/from Stand;Stand Pivot Transfers Sit to Stand: Max assist Stand pivot transfers: Total assist       General transfer comment: initially attempted sit to stand with RW and MaxA, strong posterior lean requiring MaxA to maintain upright. switched to steady and able to come to full stand with MaxAx1, had assist from family to place pads of stedy under patient. Transfer to chair totalA in stedy  Ambulation/Gait             General Gait Details: unable  Stairs            Wheelchair Mobility    Modified Rankin (Stroke Patients Only)       Balance Overall balance assessment: Needs assistance;History of Falls Sitting-balance support: Bilateral upper extremity supported;Feet supported Sitting balance-Leahy Scale: Fair Sitting balance - Comments: close S to min guard for safety   Standing balance support: Bilateral upper extremity supported;During functional activity Standing balance-Leahy Scale: Zero Standing balance comment: strong posterior lean requiring MaxA to remain upright                             Pertinent Vitals/Pain Pain Assessment: Faces Pain Score: 0-No pain Faces Pain Scale: No hurt    Home Living Family/patient expects to  be discharged to:: Skilled nursing facility                 Additional Comments: was at [facility], then his wife ended to going to [facility] and family is hoping to be able to transfer him there as well    Prior Function Level of Independence: Independent         Comments: used to be a farmer, 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 device  but has had multiple falls recently per his son     Hand Dominance        Extremity/Trunk Assessment   Upper Extremity Assessment Upper Extremity Assessment: Generalized weakness    Lower Extremity Assessment Lower Extremity Assessment: Generalized weakness    Cervical / Trunk Assessment Cervical / Trunk Assessment: Kyphotic  Communication   Communication: No difficulties  Cognition Arousal/Alertness: Awake/alert Behavior During Therapy: Impulsive Overall Cognitive Status: History of cognitive impairments - at baseline                                        General Comments      Exercises     Assessment/Plan    PT Assessment Patient needs continued PT services  PT Problem List Decreased safety awareness;Decreased mobility;Decreased strength;Decreased coordination;Decreased activity tolerance;Decreased cognition;Decreased balance       PT Treatment Interventions DME instruction;Therapeutic activities;Gait training;Therapeutic exercise;Patient/family education;Stair training;Balance training;Functional mobility training;Neuromuscular re-education    PT Goals (Current goals can be found in the Care Plan section)  Acute Rehab PT Goals Patient Stated Goal: get into [facility] so he can be with his wife PT Goal Formulation: With family Time For Goal Achievement: 01/22/19 Potential to Achieve Goals: Good    Frequency Min 2X/week   Barriers to discharge        Co-evaluation               AM-PAC PT "6 Clicks" Mobility  Outcome Measure Help needed turning from your back to your side while in a flat bed without using bedrails?: A Little Help needed moving from lying on your back to sitting on the side of a flat bed without using bedrails?: A Lot Help needed moving to and from a bed to a chair (including a wheelchair)?: Total Help needed standing up from a chair using your arms (e.g., wheelchair or bedside chair)?: A Lot Help needed to walk  in hospital room?: Total Help needed climbing 3-5 steps with a railing? : Total 6 Click Score: 10    End of Session Equipment Utilized During Treatment: Gait belt Activity Tolerance: Patient tolerated treatment well Patient left: in chair;with call bell/phone within reach;with chair alarm set;with family/visitor present Nurse Communication: Mobility status;Need for lift equipment PT Visit Diagnosis: Unsteadiness on feet (R26.81);Muscle weakness (generalized) (M62.81);Repeated falls (R29.6);Difficulty in walking, not elsewhere classified (R26.2)    Time: 2706-2376 PT Time Calculation (min) (ACUTE ONLY): 43 min   Charges:   PT Evaluation $PT Eval Moderate Complexity: 1 Mod PT Treatments $Therapeutic Activity: 23-37 mins         Deniece Ree PT, DPT, CBIS  Supplemental Physical Therapist [place]    Pager [phone] Acute Rehab Office [phone]

## 2019-01-08 NOTE — Progress Notes (Signed)
  Speech Language Pathology Treatment: Dysphagia  Patient Details Name: Casey Andrade MRN: [id number] DOB: December 12, 1919 Today's Date: 01/08/2019 Time: 6433-2951 SLP Time Calculation (min) (ACUTE ONLY): 27 min  Assessment / Plan / Recommendation Clinical Impression  Pt was encountered awake/alert and he was pleasantly confused with intermittent hallucinations during this tx session.  Pt was initially encountered talking to himself, and he was confused about where he was.  Re-oriented pt throughout this session.  Pt was observed with trials of nectar-thick liquid via cup sip and he was observed to have multiple audible swallows followed by immediate, wet, prolonged coughing (>3 minutes) and watery eyes.  Pt was then seen with trials of honey-thick liquid and he required multiple sips per bolus; however, no overt s/sx of aspiration were observed.  Pt was additionally seen with trials of puree and no clinical s/sx of aspiration or difficulty were observed.  Pt then consumed 1 trial of regular solids and he exhibited prolonged delayed coughing with changes in respiration and watery eyes.  Pt reported that he did not want to try any additional solid food at this time secondary to choking sensation, and he stated that he preferred the pureed solids.  Recommend diet change to Dysphagia 1 (puree) solids and honey-thick liquid with full supervision to enforce compensatory strategies including sitting upright, taking small bites/sips, limiting distractions, etc.  Additionally recommend that medications be administered crushed in puree.  ST will continue to monitor patient for diet tolerance and potential for clinical diet upgrade.     HPI HPI: LOFTON LEON is a 85 y.o. male with medical history significant of hypertension, hypertrophic cardiomyopathy last EF 65 to 70%, dementia, hypothyroidism, hyponatremia, and gait disturbance; who presents after being found acutely altered.  Pt's daughter reported that pt has a  baseline productive cough, but sputum production has increased in the last week.  Pt initially appeared to be flaccid on L side; however, head CT was clear.  CXR on  9/14 was unremarkable.        SLP Plan  Continue with current plan of care       Recommendations  Diet recommendations: Dysphagia 1 (puree);Honey-thick liquid Liquids provided via: Cup;Straw Medication Administration: Crushed with puree Supervision: Full supervision/cueing for compensatory strategies Compensations: Slow rate;Small sips/bites;Minimize environmental distractions Postural Changes and/or Swallow Maneuvers: Seated upright 90 degrees;Upright 30-60 min after meal                Oral Care Recommendations: Oral care BID Follow up Recommendations: Skilled Nursing facility;24 hour supervision/assistance SLP Visit Diagnosis: Dysphagia, oropharyngeal phase (R13.12) Plan: Continue with current plan of care       Bretta Bang, M.S., [place] Office: [phone]   [place] 01/08/2019, 12:04 PM

## 2019-01-08 NOTE — Progress Notes (Signed)
PROGRESS NOTE    Casey Andrade  YNW:[id number] DOB: 18-Mar-1920 DOA: 01/04/2019 PCP: Catha Gosselin, MD   Brief Narrative:  Casey Andrade is a 85 y.o. male with medical history significant of hypertension, hypertrophic cardiomyopathy last EF 65 to 70%, dementia, hypothyroidism, hyponatremia, and gait disturbance; who was brought into the ED after being found acutely altered.    Reportedly, he was Previously noted to be lethargic but arousable and speaking 15 minutes prior.  Upon EMS arrival he was initially placed on a nonrebreather and patient appeared flaccid on the left side with some muscle tone reported on the right.  Initially suspected stroke versus aspiration.  Per daughter, patient has had chronic productive cough, but had increased sputum production although reported to be clear in the last week or so.  Otherwise patient had been noted to be in fairly decent health for his age and utilized a rolling walker to ambulate as he was high falls risk.  Reportedly Patient was given Haldol afternoon around 4:30 p.m a day prior.according to daughter, he was previously given Haldol he slept for over 24 hours thereafter.  On admission into the emergency department patient was found to be febrile up to 100.8 F rectally, respirations 16-26, blood pressure noted as 97/45, and O2 saturation maintained currently on 1 L nasal cannula oxygen. CT scan of the brain was negative for any acute abnormalities, and code stroke had been canceled after patient was evaluated by neurology.  Chest x-ray showed no acute abnormalities. Labs significant for WBC 14.8.  COVID-19 negative.  No initial lactic acid was obtained.  Patient was started on empiric antibiotics of vancomycin, metronidazole, and cefepime which were then de-escalated to cefepime and then Rocephin.  Neurology had a long discussion with the family and they prefer not to do MRI for further possible stroke work-up.  For that reason, repeat CT head was done by  neurology which ruled out any stroke.  Palliative care was then consulted per family's request and family has desired for the patient to go to assisted living facility with hospice.  Assessment & Plan:   Principal Problem:   Sepsis (HCC) Active Problems:   HOCM (hypertrophic obstructive cardiomyopathy) (HCC)   Hypothyroidism   Transient hypotension   Encephalopathy   Altered mental status   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 (HCC)   Acute delirium   Palliative care encounter  Acute metabolic encephalopathy vs delirium: Likely multifactorial, could be due to acute infection, aspiration pneumonia versus Haldol.  Patient is doing better today.  When seen this morning, he was pleasant, calm and alert and oriented x2.  Did not have any complaint.  He received Zyprexa last night instead of Seroquel per family's discussion with palliative care.  I had a long discussion with patient's son Onalee Hua and daughter-in-law Lupita Leash for about 20 minutes and they think that patient is more confused than yesterday however they have agreed to continue Zyprexa for 1 more day.    Possible aspiration pneumonia: chest x-ray suggests no active disease however radiological findings could lag behind clinical findings and he had fever of 100.8 and due to altered mental status, he may have aspirated.  Continue Rocephin for now.  Culture negative.  Possible dysphagia: Status post MBS.  SLP on board.  On dysphagia 3 diet.  Dehydration/decreased urine output: Still with low urine output however has stable and normal BUN/creatinine.  Wonder if [employer] and O's are accurate.  Hypothyroidism: Continue Synthroid.  History of hypertension  with transient hypotension: Blood pressure fluctuating but acceptable.  Continue PRN hydralazine.  DVT prophylaxis: Lovenox Code Status: DNR Family Communication: Plan of care discussed in length with patient son Shanon Brow and daughter-in-law Butch Penny over  the phone.  We discussed about 20 minutes.  They would want for him to stay in the hospital for at least 1 to 2 days and possibly discharge on Monday with hospice. Disposition Plan: Family wants him to stay here for at least 1-2 more days for him to get better before he goes to ALF on Monday with hospice.  Consultants:   None  Procedures:   None  Antimicrobials:   Cefepime> 01/06/2019  Rocephin 01/06/2019   Subjective: Patient seen and examined earlier this morning.  He was pleasant, alert and oriented x2.  He had no complaints.   Objective: Vitals:   01/07/19 0421 01/07/19 1331 01/07/19 2150 01/08/19 0541  BP: (!) 161/56 (!) 183/79 (!) 136/94 (!) 195/95  Pulse: (!) 57 61 79 79  Resp: (!) 9 16    Temp: 98.6 F (37 C) 98.2 F (36.8 C) 99.1 F (37.3 C) 98.2 F (36.8 C)  TempSrc: Axillary Oral Oral Oral  SpO2: 99% 98% 96%   Weight:      Height:        Intake/Output Summary (Last 24 hours) at 01/08/2019 1231 Last data filed at 01/08/2019 0953 Gross per 24 hour  Intake 2388.04 ml  Output --  Net 2388.04 ml   Filed Weights   01/05/19 2308 01/06/19 0348  Weight: 64.9 kg 65.8 kg    Examination:   General exam: Appears calm and comfortable  Respiratory system: Clear to auscultation. Respiratory effort normal. Cardiovascular system: S1 & S2 heard, RRR. No JVD, murmurs, rubs, gallops or clicks. No pedal edema. Gastrointestinal system: Abdomen is nondistended, soft and nontender. No organomegaly or masses felt. Normal bowel sounds heard. Central nervous system: Alert and oriented x2. No focal neurological deficits. Extremities: Symmetric 5 x 5 power. Skin: No rashes, lesions or ulcers.  Psychiatry: Judgement and insight appear poor. Mood & affect flat   Data Reviewed: I have personally reviewed following labs and imaging studies  CBC: Recent Labs  Lab 01/04/19 1300 01/04/19 1308 01/05/19 0646 01/06/19 0214 01/07/19 0150 01/08/19 0143  WBC 14.8*  --  7.2 6.1  6.2 5.6  NEUTROABS 13.3*  --   --  4.3 4.2 3.5  HGB 14.3 14.3 14.4 14.2 13.4 13.9  HCT 42.9 42.0 44.2 41.3 40.9 42.7  MCV 98.6  --  100.2* 98.8 98.6 101.2*  PLT 218  --  191 186 204 035   Basic Metabolic Panel: Recent Labs  Lab 01/04/19 1300 01/04/19 1308 01/05/19 0646 01/06/19 0214 01/07/19 0150 01/08/19 0143  NA 136 137 137 139 141 140  K 3.8 4.1 3.7 3.7 3.6 3.9  CL 104 101 106 107 110 110  CO2 21*  --  21* 20* 20* 21*  GLUCOSE 118* 116* 103* 71 116* 98  BUN 16 21 19 21 17 11   CREATININE 0.75 0.60* 0.87 0.83 0.76 0.79  CALCIUM 8.7*  --  8.8* 8.6* 8.7* 8.4*  MG  --   --   --  1.9  --   --    GFR: Estimated Creatinine Clearance: 47.6 mL/min (by C-G formula based on SCr of 0.79 mg/dL). Liver Function Tests: Recent Labs  Lab 01/04/19 1300 01/06/19 0214 01/07/19 0150 01/08/19 0143  AST 23 25 39 37  ALT 20 20 21 21   ALKPHOS 76  67 65 63  BILITOT 1.3* 1.3* 0.5 0.5  PROT 5.9* 5.7* 6.0* 5.7*  ALBUMIN 3.3* 3.2* 3.3* 3.2*   No results for input(s): LIPASE, AMYLASE in the last 168 hours. No results for input(s): AMMONIA in the last 168 hours. Coagulation Profile: Recent Labs  Lab 01/04/19 1300  INR 1.0   Cardiac Enzymes: No results for input(s): CKTOTAL, CKMB, CKMBINDEX, TROPONINI in the last 168 hours. BNP (last 3 results) No results for input(s): PROBNP in the last 8760 hours. HbA1C: No results for input(s): HGBA1C in the last 72 hours. CBG: Recent Labs  Lab 01/04/19 1155 01/06/19 0442 01/06/19 0650  GLUCAP 112* 66* 98   Lipid Profile: No results for input(s): CHOL, HDL, LDLCALC, TRIG, CHOLHDL, LDLDIRECT in the last 72 hours. Thyroid Function Tests: No results for input(s): TSH, T4TOTAL, FREET4, T3FREE, THYROIDAB in the last 72 hours. Anemia Panel: No results for input(s): VITAMINB12, FOLATE, FERRITIN, TIBC, IRON, RETICCTPCT in the last 72 hours. Sepsis Labs: Recent Labs  Lab 01/04/19 1327 01/04/19 1755 01/04/19 2257  PROCALCITON <0.10  --   --     LATICACIDVEN  --  2.1* 1.4    Recent Results (from the past 240 hour(s))  SARS Coronavirus 2 [facility](Hospital order, Performed in [facility] hospital lab) Nasopharyngeal Nasopharyngeal Swab     Status: None   Collection Time: 01/04/19  1:50 PM   Specimen: Nasopharyngeal Swab  Result Value Ref Range Status   SARS Coronavirus 2 NEGATIVE NEGATIVE Final    Comment: (NOTE) If result is NEGATIVE SARS-CoV-2 target nucleic acids are NOT DETECTED. The SARS-CoV-2 RNA is generally detectable in upper and lower  respiratory specimens during the acute phase of infection. The lowest  concentration of SARS-CoV-2 viral copies this assay can detect is 250  copies / mL. A negative result does not preclude SARS-CoV-2 infection  and should not be used as the sole basis for treatment or other  patient management decisions.  A negative result may occur with  improper specimen collection / handling, submission of specimen other  than nasopharyngeal swab, presence of viral mutation(s) within the  areas targeted by this assay, and inadequate number of viral copies  (<250 copies / mL). A negative result must be combined with clinical  observations, patient history, and epidemiological information. If result is POSITIVE SARS-CoV-2 target nucleic acids are DETECTED. The SARS-CoV-2 RNA is generally detectable in upper and lower  respiratory specimens dur ing the acute phase of infection.  Positive  results are indicative of active infection with SARS-CoV-2.  Clinical  correlation with patient history and other diagnostic information is  necessary to determine patient infection status.  Positive results do  not rule out bacterial infection or co-infection with other viruses. If result is PRESUMPTIVE POSTIVE SARS-CoV-2 nucleic acids MAY BE PRESENT.   A presumptive positive result was obtained on the submitted specimen  and confirmed on repeat testing.  While 2019 novel coronavirus  (SARS-CoV-2) nucleic acids may be  present in the submitted sample  additional confirmatory testing may be necessary for epidemiological  and / or clinical management purposes  to differentiate between  SARS-CoV-2 and other Sarbecovirus currently known to infect humans.  If clinically indicated additional testing with an alternate test  methodology [phone]([id number]) is advised. The SARS-CoV-2 RNA is generally  detectable in upper and lower respiratory sp ecimens during the acute  phase of infection. The expected result is Negative. Fact Sheet for Patients:  BoilerBrush.com.cyhttps://www.fda.gov/media/136312/download Fact Sheet for Healthcare Providers: https://pope.com/https://www.fda.gov/media/136313/download This test is not  yet approved or cleared by the [place] FDA and has been authorized for detection and/or diagnosis of SARS-CoV-2 by FDA under an Emergency Use Authorization (EUA).  This EUA will remain in effect (meaning this test can be used) for the duration of the COVID-19 declaration under Section 564(b)(1) of the Act, 21 U.S.C. section 360bbb-3(b)(1), unless the authorization is terminated or revoked sooner. Performed at [facility], [address] [place], [place] [address]   Culture, blood (x 2)     Status: Abnormal   Collection Time: 01/04/19  5:55 PM   Specimen: BLOOD  Result Value Ref Range Status   Specimen Description BLOOD RIGHT ANTECUBITAL  Final   Special Requests   Final    BOTTLES DRAWN AEROBIC AND ANAEROBIC Blood Culture results may not be optimal due to an inadequate volume of blood received in culture bottles   Culture  Setup Time   Final    GRAM POSITIVE COCCI ANAEROBIC BOTTLE ONLY CRITICAL RESULT CALLED TO, READ BACK BY AND VERIFIED WITH: T. BAUMEISTER, PHARMD AT 1405 ON 01/06/19 BY C. JESSUP, MT.    Culture (A)  Final    STAPHYLOCOCCUS SPECIES (COAGULASE NEGATIVE) THE SIGNIFICANCE OF ISOLATING THIS ORGANISM FROM A SINGLE SET OF BLOOD CULTURES WHEN MULTIPLE SETS ARE DRAWN IS UNCERTAIN. PLEASE NOTIFY THE MICROBIOLOGY  DEPARTMENT WITHIN ONE WEEK IF SPECIATION AND SENSITIVITIES ARE REQUIRED. Performed at [facility], [address] [place], [place] [address]    Report Status 01/08/2019 FINAL  Final  Blood Culture ID Panel (Reflexed)     Status: Abnormal   Collection Time: 01/04/19  5:55 PM  Result Value Ref Range Status   Enterococcus species NOT DETECTED NOT DETECTED Final   Listeria monocytogenes NOT DETECTED NOT DETECTED Final   Staphylococcus species DETECTED (A) NOT DETECTED Final    Comment: Methicillin (oxacillin) susceptible coagulase negative staphylococcus. Possible blood culture contaminant (unless isolated from more than one blood culture draw or clinical case suggests pathogenicity). No antibiotic treatment is indicated for blood  culture contaminants. CRITICAL RESULT CALLED TO, READ BACK BY AND VERIFIED WITH: T. BAUMEISTER, PHARMD AT 1405 ON 01/06/19 BY C. JESSUP, MT.    Staphylococcus aureus (BCID) NOT DETECTED NOT DETECTED Final   Methicillin resistance NOT DETECTED NOT DETECTED Final   Streptococcus species NOT DETECTED NOT DETECTED Final   Streptococcus agalactiae NOT DETECTED NOT DETECTED Final   Streptococcus pneumoniae NOT DETECTED NOT DETECTED Final   Streptococcus pyogenes NOT DETECTED NOT DETECTED Final   Acinetobacter baumannii NOT DETECTED NOT DETECTED Final   Enterobacteriaceae species NOT DETECTED NOT DETECTED Final   Enterobacter cloacae complex NOT DETECTED NOT DETECTED Final   Escherichia coli NOT DETECTED NOT DETECTED Final   Klebsiella oxytoca NOT DETECTED NOT DETECTED Final   Klebsiella pneumoniae NOT DETECTED NOT DETECTED Final   Proteus species NOT DETECTED NOT DETECTED Final   Serratia marcescens NOT DETECTED NOT DETECTED Final   Haemophilus influenzae NOT DETECTED NOT DETECTED Final   Neisseria meningitidis NOT DETECTED NOT DETECTED Final   Pseudomonas aeruginosa NOT DETECTED NOT DETECTED Final   Candida albicans NOT DETECTED NOT DETECTED Final   Candida  glabrata NOT DETECTED NOT DETECTED Final   Candida krusei NOT DETECTED NOT DETECTED Final   Candida parapsilosis NOT DETECTED NOT DETECTED Final   Candida tropicalis NOT DETECTED NOT DETECTED Final    Comment: Performed at [facility], [address] [place], [place] [address]  Culture, blood (x 2)  Status: None (Preliminary result)   Collection Time: 01/04/19 10:27 PM   Specimen: BLOOD  Result Value Ref Range Status   Specimen Description BLOOD RIGHT ANTECUBITAL  Final   Special Requests   Final    BOTTLES DRAWN AEROBIC AND ANAEROBIC Blood Culture adequate volume   Culture   Final    NO GROWTH 3 DAYS Performed at [facility], [address] [place], [place] [address]    Report Status PENDING  Incomplete  MRSA PCR Screening     Status: None   Collection Time: 01/05/19  4:28 PM   Specimen: Nasal Mucosa; Nasopharyngeal  Result Value Ref Range Status   MRSA by PCR NEGATIVE NEGATIVE Final    Comment:        The GeneXpert MRSA Assay (FDA approved for NASAL specimens only), is one component of a comprehensive MRSA colonization surveillance program. It is not intended to diagnose MRSA infection nor to guide or monitor treatment for MRSA infections. Performed at [facility], [address] [place], [place] [address]       Radiology Studies: Ct Head Wo Contrast  Result Date: 01/06/2019 CLINICAL DATA:  Stroke, follow-up EXAM: CT HEAD WITHOUT CONTRAST TECHNIQUE: Contiguous axial images were obtained from the base of the skull through the vertex without intravenous contrast. COMPARISON:  Head CT 01/04/2019 FINDINGS: Brain: There is no acute intracranial hemorrhage. No demarcated cortical infarction. No evidence of intracranial mass. No midline shift or extra-axial fluid collection. Redemonstrated chronic right basal ganglia lacunar infarct. Ill-defined hypoattenuation of the cerebral white matter is nonspecific, but consistent with chronic small vessel ischemic  disease. Moderate generalized parenchymal atrophy. Vascular: No hyperdense vessel Skull: No calvarial fracture. Sinuses/Orbits: Imaged globes and orbits demonstrate no acute abnormality. Trace ethmoid sinus mucosal thickening. No significant mastoid effusion IMPRESSION: No CT evidence of acute infarct. Consider brain MRI for further evaluation, as clinically warranted. Chronic small vessel ischemic disease with redemonstrated chronic right basal ganglia lacunar infarct. Moderate generalized atrophy. Electronically Signed   By: Jackey Loge   On: 01/06/2019 15:15   Dg Swallowing Func-speech Pathology  Result Date: 01/07/2019 Objective Swallowing Evaluation: Type of Study: Bedside Swallow Evaluation  Patient Details Name: Casey Andrade MRN: [id number] Date of Birth: [birth year] Today's Date: 01/07/2019 Time: SLP Start Time (ACUTE ONLY): 6803 -SLP Stop Time (ACUTE ONLY): 0904 SLP Time Calculation (min) (ACUTE ONLY): 12 min Past Medical History: Past Medical History: Diagnosis Date  Dyspnea   increasing  Essential hypertension   History of echocardiogram 04/27/2010  EF was in the range of 65-70% / Mitral valve, systolic anterior motion of the anterior mitral chordae               Hypertension   Hypertr obst cardiomyop   Hyponatremia   for which nephrology is seeing him  Hypothyroidism   Left ventricular outflow tract obstruction   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 EF 65-70%. GR 1 DD. Moderate-severe MR HPI: Casey Andrade is a 85 y.o. male with medical history significant of hypertension, hypertrophic  cardiomyopathy last EF 65 to 70%, dementia, hypothyroidism, hyponatremia, and gait disturbance;  who presents after being found acutely altered.  Pt's daughter reported that pt has a baseline productive cough, but sputum production has increased in the last week.  Pt initially appeared to be flaccid on L side; however, head CT was clear.  CXR on  9/14 was unremarkable.   Subjective: Pt was encountered lying reclined in bed, moaning with bilateral mitts in place.  RN reported that pt was oriented x0 and that he had been combative this morning. Assessment / Plan / Recommendation CHL IP CLINICAL IMPRESSIONS 01/07/2019 Clinical Impression Patient presents with a moderate pharyngeal dysphagia. Oral phase was grossly normal with complete mastication of solids, good bolus control and complete oral clearance. A delayed swallow initiation to the valleculae with nectar thick, pudding and cracker and to pyriforms with thin liquids. Incomplete laryngeal anterior movement and closure across all consistencies. Laryngeal penetration/aspiration with thin liquids of any quantity. Patient did not initiate a throat clear or cough and was uanble to clear with a cued cough. Recommend a Dys 3, nectar thickened liquid diet. Medications given whole in puree. ST to follow for diet tolerance, upgrades and aspiration precaution training.  SLP Visit Diagnosis Dysphagia, unspecified (R13.10) Attention and concentration deficit following -- Frontal lobe and executive function deficit following -- Impact on safety and function Moderate aspiration risk   CHL IP TREATMENT RECOMMENDATION 01/07/2019 Treatment Recommendations Therapy as outlined in treatment plan below   Prognosis 01/07/2019 Prognosis for Safe Diet Advancement Good Barriers to Reach Goals -- Barriers/Prognosis Comment -- CHL IP DIET RECOMMENDATION 01/07/2019 SLP Diet Recommendations Dysphagia 3 (Mech soft) solids;Nectar thick liquid Liquid Administration via Cup Medication Administration  Whole meds with puree Compensations Slow rate;Small sips/bites;Minimize environmental distractions Postural Changes Seated upright at 90 degrees   CHL IP OTHER RECOMMENDATIONS 01/07/2019 Recommended Consults -- Oral Care Recommendations Oral care BID Other Recommendations Order thickener from pharmacy   CHL IP FOLLOW UP RECOMMENDATIONS 01/07/2019 Follow up Recommendations Skilled Nursing facility   [facility] IP FREQUENCY AND DURATION 01/06/2019 Speech Therapy Frequency (ACUTE ONLY) min 2x/week Treatment Duration 2 weeks      CHL IP ORAL PHASE 01/07/2019 Oral Phase WFL Oral - Pudding Teaspoon -- Oral - Pudding Cup -- Oral - Honey Teaspoon -- Oral - Honey Cup -- Oral - Nectar Teaspoon -- Oral - Nectar Cup -- Oral - Nectar Straw -- Oral - Thin Teaspoon -- Oral - Thin Cup -- Oral - Thin Straw -- Oral - Puree -- Oral - Mech Soft -- Oral - Regular -- Oral - Multi-Consistency -- Oral - Pill -- Oral Phase - Comment --  CHL IP PHARYNGEAL PHASE 01/07/2019 Pharyngeal Phase Impaired Pharyngeal- Pudding Teaspoon Delayed swallow initiation-vallecula;Reduced laryngeal elevation;Reduced airway/laryngeal closure;Pharyngeal residue - valleculae Pharyngeal -- Pharyngeal- Pudding Cup -- Pharyngeal -- Pharyngeal- Honey Teaspoon -- Pharyngeal -- Pharyngeal- Honey Cup -- Pharyngeal -- Pharyngeal- Nectar Teaspoon -- Pharyngeal -- Pharyngeal- Nectar Cup Delayed swallow initiation-vallecula;Reduced laryngeal elevation;Reduced airway/laryngeal closure Pharyngeal Material does not enter airway Pharyngeal- Nectar Straw -- Pharyngeal -- Pharyngeal- Thin Teaspoon Delayed swallow initiation-pyriform sinuses;Reduced airway/laryngeal closure;Reduced laryngeal elevation;Trace aspiration;Pharyngeal residue - valleculae Pharyngeal Material enters airway, remains ABOVE vocal cords and not ejected out Pharyngeal- Thin Cup Delayed swallow initiation-pyriform sinuses;Reduced laryngeal elevation;Reduced airway/laryngeal closure;Moderate aspiration;Pharyngeal residue -  valleculae Pharyngeal Material enters airway, remains ABOVE vocal cords and not ejected out Pharyngeal- Thin Straw -- Pharyngeal -- Pharyngeal- Puree -- Pharyngeal -- Pharyngeal- Mechanical Soft Delayed swallow initiation-vallecula;Reduced laryngeal elevation;Reduced airway/laryngeal closure Pharyngeal -- Pharyngeal- Regular -- Pharyngeal -- Pharyngeal- Multi-consistency -- Pharyngeal -- Pharyngeal-  Pill -- Pharyngeal -- Pharyngeal Comment --  No flowsheet data found. Lindalou Hose Ward, MA, CCC-SLP 01/07/2019 1:55 PM               Scheduled Meds:  enoxaparin (LOVENOX) injection  40 mg Subcutaneous Q24H   finasteride  5 mg Oral QHS   levothyroxine  25 mcg Intravenous Daily   mouth rinse  15 mL Mouth Rinse BID   metoprolol tartrate  2.5 mg Intravenous Q8H   OLANZapine zydis  2.5 mg Oral QHS   pantoprazole (PROTONIX) IV  40 mg Intravenous Q24H   sodium chloride flush  3 mL Intravenous Q12H   Continuous Infusions:  dextrose 5 % and 0.9 % NaCl with KCl 40 mEq/L 100 mL/hr at 01/08/19 0800     LOS: 4 days   Time spent: Total 45 minutes spent that includes 20 minutes spent on family conversation.   Hughie Closs, MD [facility] Pager [phone]  If 7PM-7AM, please contact night-coverage [email or web address] Password [facility] 01/08/2019, 12:31 PM

## 2019-01-09 LAB — CULTURE, BLOOD (ROUTINE X 2)
Culture: NO GROWTH
Special Requests: ADEQUATE

## 2019-01-09 LAB — CBC WITH DIFFERENTIAL/PLATELET
Abs Immature Granulocytes: 0.01 10*3/uL (ref 0.00–0.07)
Basophils Absolute: 0.1 10*3/uL (ref 0.0–0.1)
Basophils Relative: 1 %
Eosinophils Absolute: 0.3 10*3/uL (ref 0.0–0.5)
Eosinophils Relative: 6 %
HCT: 41.7 % (ref 39.0–52.0)
Hemoglobin: 13.9 g/dL (ref 13.0–17.0)
Immature Granulocytes: 0 %
Lymphocytes Relative: 23 %
Lymphs Abs: 1.2 10*3/uL (ref 0.7–4.0)
MCH: 32.6 pg (ref 26.0–34.0)
MCHC: 33.3 g/dL (ref 30.0–36.0)
MCV: 97.9 fL (ref 80.0–100.0)
Monocytes Absolute: 0.6 10*3/uL (ref 0.1–1.0)
Monocytes Relative: 13 %
Neutro Abs: 2.9 10*3/uL (ref 1.7–7.7)
Neutrophils Relative %: 57 %
Platelets: 187 10*3/uL (ref 150–400)
RBC: 4.26 MIL/uL (ref 4.22–5.81)
RDW: 12.8 % (ref 11.5–15.5)
WBC: 5.1 10*3/uL (ref 4.0–10.5)
nRBC: 0 % (ref 0.0–0.2)

## 2019-01-09 LAB — GLUCOSE, CAPILLARY
Glucose-Capillary: 87 mg/dL (ref 70–99)
Glucose-Capillary: 96 mg/dL (ref 70–99)

## 2019-01-09 NOTE — Progress Notes (Signed)
MD concerned about Pt's BP being elevated on previous shifts when there are PRN meds ordered to give. Night & day shift Charge RN made aware. BP checked at 0828 and was 181/98. PRN hydralazine 10mg  IV given @ 0834. Recheck BP @ 1000 was 116/56. Pt resting comfortably in chair. MD at bedside, made aware BP has come down. Will continue to monitor.

## 2019-01-09 NOTE — Progress Notes (Addendum)
PROGRESS NOTE    [name]  CEY:[id number] DOB: [birth year] DOA: 01/04/2019 PCP: Catha Gosselin, MD   Brief Narrative:  Casey Andrade is a 85 y.o. male with medical history significant of hypertension, hypertrophic cardiomyopathy last EF 65 to 70%, dementia, hypothyroidism, hyponatremia, and gait disturbance; who was brought into the ED after being found acutely altered.    Reportedly, he was Previously noted to be lethargic but arousable and speaking 15 minutes prior.  Upon EMS arrival he was initially placed on a nonrebreather and patient appeared flaccid on the left side with some muscle tone reported on the right.  Initially suspected stroke versus aspiration.  Per daughter, patient has had chronic productive cough, but had increased sputum production although reported to be clear in the last week or so.  Otherwise patient had been noted to be in fairly decent health for his age and utilized a rolling walker to ambulate as he was high falls risk.  Reportedly Patient was given Haldol afternoon around 4:30 p.m a day prior.according to daughter, he was previously given Haldol he slept for over 24 hours thereafter.  On admission into the emergency department patient was found to be febrile up to 100.8 F rectally, respirations 16-26, blood pressure noted as 97/45, and O2 saturation maintained currently on 1 L nasal cannula oxygen. CT scan of the brain was negative for any acute abnormalities, and code stroke had been canceled after patient was evaluated by neurology.  Chest x-ray showed no acute abnormalities. Labs significant for WBC 14.8.  COVID-19 negative.  No initial lactic acid was obtained.  Patient was started on empiric antibiotics of vancomycin, metronidazole, and cefepime which were then de-escalated to cefepime and then Rocephin.  Neurology had a long discussion with the family and they prefer not to do MRI for further possible stroke work-up.  For that reason, repeat CT head was done by  neurology which ruled out any stroke.  Palliative care was then consulted per family's request and family has desired for the patient to go to assisted living facility with hospice early next week.  Assessment & Plan:   Principal Problem:   Sepsis (HCC) Active Problems:   HOCM (hypertrophic obstructive cardiomyopathy) (HCC)   Hypothyroidism   Transient hypotension   Encephalopathy   Altered mental status   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 (HCC)   Acute delirium   Palliative care encounter  Acute metabolic encephalopathy vs delirium: Likely multifactorial, could be due to acute infection, aspiration pneumonia versus Haldol.  Patient was looking physically stronger, more alert however he was still slightly confused.  We will continue Zyprexa nightly in agreement with patient's children.  Delirium precautions.  Once again today I talked to primary RN and advised them to keep his room late with curtains raised during the daytime from 9 AM to 9 PM and let him sleep from 9 PM to 7 AM.  Possible aspiration pneumonia: chest x-ray suggests no active disease however radiological findings could lag behind clinical findings and he had fever of 100.8 and due to altered mental status, he may have aspirated.  Continue Rocephin until tomorrow.  Possible dysphagia: Status post MBS.  SLP on board.  On dysphagia 1 diet.  Dehydration/decreased urine output: Still low urine output but patient clinically is doing fine.  Does not look dehydrated anymore.  I doubt accuracy of I's and O's.  Will stop IV fluids.  Hypothyroidism: Continue Synthroid.  History of hypertension with transient hypotension: Blood  pressure has consistently remained elevated since yesterday, often systolic more than 160 or diastolic more than 100.  Interestingly, patient has hydralazine as needed ordered but he did not receive any single dose.  Expressed my concerns with the nurse.  Charge  nurse from last night and this morning also made aware.  Continue current management.  DVT prophylaxis: Lovenox Code Status: DNR Family Communication: Plan of care discussed in length with patient son Casey Hua and daughter-in-law Casey Andrade, daughter Casey Andrade over the phone.  We discussed about 13 minutes.  They would want the patient to get some more physical therapy over the weekend with potential discharge early next week. Disposition Plan: Family wants him to stay here for at least 1-2 more days for him to get better before he goes to ALF on Monday with hospice.  Consultants:   None  Procedures:   None  Antimicrobials:   Cefepime> 01/06/2019  Rocephin 01/06/2019   Subjective: Patient seen and examined early this morning.  He was sitting in the chair however he seemed to be hallucinating and talking to himself.  He was alert, looks brighter today but still was confused.  He was pleasant.  Objective: Vitals:   01/09/19 0542 01/09/19 0828 01/09/19 1000 01/09/19 1404  BP: (!) 176/102 (!) 181/98 (!) 116/56 138/83  Pulse: 71  74 90  Resp: 16     Temp: 98.4 F (36.9 C)   97.7 F (36.5 C)  TempSrc:    Oral  SpO2: 99%   99%  Weight:      Height:        Intake/Output Summary (Last 24 hours) at 01/09/2019 1410 Last data filed at 01/09/2019 0853 Gross per 24 hour  Intake 240 ml  Output 300 ml  Net -60 ml   Filed Weights   01/05/19 2308 01/06/19 0348  Weight: 64.9 kg 65.8 kg    Examination:   General exam: Appears calm and comfortable but confused Respiratory system: Clear to auscultation. Respiratory effort normal. Cardiovascular system: S1 & S2 heard, RRR. No JVD, murmurs, rubs, gallops or clicks. No pedal edema. Gastrointestinal system: Abdomen is nondistended, soft and nontender. No organomegaly or masses felt. Normal bowel sounds heard. Central nervous system: Alert and oriented x2. No focal neurological deficits. Extremities: Symmetric 5 x 5 power. Skin: No rashes,  lesions or ulcers.  Psychiatry: Judgement and insight appear poor. Mood & affect appropriate.   Data Reviewed: I have personally reviewed following labs and imaging studies  CBC: Recent Labs  Lab 01/04/19 1300  01/05/19 0646 01/06/19 0214 01/07/19 0150 01/08/19 0143 01/09/19 0226  WBC 14.8*  --  7.2 6.1 6.2 5.6 5.1  NEUTROABS 13.3*  --   --  4.3 4.2 3.5 2.9  HGB 14.3   < > 14.4 14.2 13.4 13.9 13.9  HCT 42.9   < > 44.2 41.3 40.9 42.7 41.7  MCV 98.6  --  100.2* 98.8 98.6 101.2* 97.9  PLT 218  --  191 186 204 187 187   < > = values in this interval not displayed.   Basic Metabolic Panel: Recent Labs  Lab 01/04/19 1300 01/04/19 1308 01/05/19 0646 01/06/19 0214 01/07/19 0150 01/08/19 0143  NA 136 137 137 139 141 140  K 3.8 4.1 3.7 3.7 3.6 3.9  CL 104 101 106 107 110 110  CO2 21*  --  21* 20* 20* 21*  GLUCOSE 118* 116* 103* 71 116* 98  BUN 16 21 19 21 17 11   CREATININE 0.75 0.60* 0.87  0.83 0.76 0.79  CALCIUM 8.7*  --  8.8* 8.6* 8.7* 8.4*  MG  --   --   --  1.9  --   --    GFR: Estimated Creatinine Clearance: 47.6 mL/min (by C-G formula based on SCr of 0.79 mg/dL). Liver Function Tests: Recent Labs  Lab 01/04/19 1300 01/06/19 0214 01/07/19 0150 01/08/19 0143  AST 23 25 39 37  ALT 20 20 21 21   ALKPHOS 76 67 65 63  BILITOT 1.3* 1.3* 0.5 0.5  PROT 5.9* 5.7* 6.0* 5.7*  ALBUMIN 3.3* 3.2* 3.3* 3.2*   No results for input(s): LIPASE, AMYLASE in the last 168 hours. No results for input(s): AMMONIA in the last 168 hours. Coagulation Profile: Recent Labs  Lab 01/04/19 1300  INR 1.0   Cardiac Enzymes: No results for input(s): CKTOTAL, CKMB, CKMBINDEX, TROPONINI in the last 168 hours. BNP (last 3 results) No results for input(s): PROBNP in the last 8760 hours. HbA1C: No results for input(s): HGBA1C in the last 72 hours. CBG: Recent Labs  Lab 01/06/19 0442 01/06/19 0650 01/08/19 1937 01/09/19 0738 01/09/19 1237  GLUCAP 66* 98 113* 87 96   Lipid Profile:  No results for input(s): CHOL, HDL, LDLCALC, TRIG, CHOLHDL, LDLDIRECT in the last 72 hours. Thyroid Function Tests: No results for input(s): TSH, T4TOTAL, FREET4, T3FREE, THYROIDAB in the last 72 hours. Anemia Panel: No results for input(s): VITAMINB12, FOLATE, FERRITIN, TIBC, IRON, RETICCTPCT in the last 72 hours. Sepsis Labs: Recent Labs  Lab 01/04/19 1327 01/04/19 1755 01/04/19 2257  PROCALCITON <0.10  --   --   LATICACIDVEN  --  2.1* 1.4    Recent Results (from the past 240 hour(s))  SARS Coronavirus 2 [facility] order, Performed in [facility] hospital lab) Nasopharyngeal Nasopharyngeal Swab     Status: None   Collection Time: 01/04/19  1:50 PM   Specimen: Nasopharyngeal Swab  Result Value Ref Range Status   SARS Coronavirus 2 NEGATIVE NEGATIVE Final    Comment: (NOTE) If result is NEGATIVE SARS-CoV-2 target nucleic acids are NOT DETECTED. The SARS-CoV-2 RNA is generally detectable in upper and lower  respiratory specimens during the acute phase of infection. The lowest  concentration of SARS-CoV-2 viral copies this assay can detect is 250  copies / mL. A negative result does not preclude SARS-CoV-2 infection  and should not be used as the sole basis for treatment or other  patient management decisions.  A negative result may occur with  improper specimen collection / handling, submission of specimen other  than nasopharyngeal swab, presence of viral mutation(s) within the  areas targeted by this assay, and inadequate number of viral copies  (<250 copies / mL). A negative result must be combined with clinical  observations, patient history, and epidemiological information. If result is POSITIVE SARS-CoV-2 target nucleic acids are DETECTED. The SARS-CoV-2 RNA is generally detectable in upper and lower  respiratory specimens dur ing the acute phase of infection.  Positive  results are indicative of active infection with SARS-CoV-2.  Clinical  correlation with patient  history and other diagnostic information is  necessary to determine patient infection status.  Positive results do  not rule out bacterial infection or co-infection with other viruses. If result is PRESUMPTIVE POSTIVE SARS-CoV-2 nucleic acids MAY BE PRESENT.   A presumptive positive result was obtained on the submitted specimen  and confirmed on repeat testing.  While 2019 novel coronavirus  (SARS-CoV-2) nucleic acids may be present in the submitted sample  additional confirmatory testing  may be necessary for epidemiological  and / or clinical management purposes  to differentiate between  SARS-CoV-2 and other Sarbecovirus currently known to infect humans.  If clinically indicated additional testing with an alternate test  methodology [phone]([id number]) is advised. The SARS-CoV-2 RNA is generally  detectable in upper and lower respiratory sp ecimens during the acute  phase of infection. The expected result is Negative. Fact Sheet for Patients:  BoilerBrush.com.cyhttps://www.fda.gov/media/136312/download Fact Sheet for Healthcare Providers: https://pope.com/https://www.fda.gov/media/136313/download This test is not yet approved or cleared by the [place] FDA and has been authorized for detection and/or diagnosis of SARS-CoV-2 by FDA under an Emergency Use Authorization (EUA).  This EUA will remain in effect (meaning this test can be used) for the duration of the COVID-19 declaration under Section 564(b)(1) of the Act, 21 U.S.C. section 360bbb-3(b)(1), unless the authorization is terminated or revoked sooner. Performed at [facility], [address] [place], [place] [address]   Culture, blood (x 2)     Status: Abnormal   Collection Time: 01/04/19  5:55 PM   Specimen: BLOOD  Result Value Ref Range Status   Specimen Description BLOOD RIGHT ANTECUBITAL  Final   Special Requests   Final    BOTTLES DRAWN AEROBIC AND ANAEROBIC Blood Culture results may not be optimal due to an inadequate volume of blood received in  culture bottles   Culture  Setup Time   Final    GRAM POSITIVE COCCI ANAEROBIC BOTTLE ONLY CRITICAL RESULT CALLED TO, READ BACK BY AND VERIFIED WITH: T. BAUMEISTER, PHARMD AT 1405 ON 01/06/19 BY C. JESSUP, MT.    Culture (A)  Final    STAPHYLOCOCCUS SPECIES (COAGULASE NEGATIVE) THE SIGNIFICANCE OF ISOLATING THIS ORGANISM FROM A SINGLE SET OF BLOOD CULTURES WHEN MULTIPLE SETS ARE DRAWN IS UNCERTAIN. PLEASE NOTIFY THE MICROBIOLOGY DEPARTMENT WITHIN ONE WEEK IF SPECIATION AND SENSITIVITIES ARE REQUIRED. Performed at [facility], [address] [place], [place] [address]    Report Status 01/08/2019 FINAL  Final  Blood Culture ID Panel (Reflexed)     Status: Abnormal   Collection Time: 01/04/19  5:55 PM  Result Value Ref Range Status   Enterococcus species NOT DETECTED NOT DETECTED Final   Listeria monocytogenes NOT DETECTED NOT DETECTED Final   Staphylococcus species DETECTED (A) NOT DETECTED Final    Comment: Methicillin (oxacillin) susceptible coagulase negative staphylococcus. Possible blood culture contaminant (unless isolated from more than one blood culture draw or clinical case suggests pathogenicity). No antibiotic treatment is indicated for blood  culture contaminants. CRITICAL RESULT CALLED TO, READ BACK BY AND VERIFIED WITH: T. BAUMEISTER, PHARMD AT 1405 ON 01/06/19 BY C. JESSUP, MT.    Staphylococcus aureus (BCID) NOT DETECTED NOT DETECTED Final   Methicillin resistance NOT DETECTED NOT DETECTED Final   Streptococcus species NOT DETECTED NOT DETECTED Final   Streptococcus agalactiae NOT DETECTED NOT DETECTED Final   Streptococcus pneumoniae NOT DETECTED NOT DETECTED Final   Streptococcus pyogenes NOT DETECTED NOT DETECTED Final   Acinetobacter baumannii NOT DETECTED NOT DETECTED Final   Enterobacteriaceae species NOT DETECTED NOT DETECTED Final   Enterobacter cloacae complex NOT DETECTED NOT DETECTED Final   Escherichia coli NOT DETECTED NOT DETECTED Final   Klebsiella  oxytoca NOT DETECTED NOT DETECTED Final   Klebsiella pneumoniae NOT DETECTED NOT DETECTED Final   Proteus species NOT DETECTED NOT DETECTED Final   Serratia marcescens NOT DETECTED NOT DETECTED Final   Haemophilus influenzae NOT DETECTED NOT DETECTED Final   Neisseria meningitidis NOT DETECTED  NOT DETECTED Final   Pseudomonas aeruginosa NOT DETECTED NOT DETECTED Final   Candida albicans NOT DETECTED NOT DETECTED Final   Candida glabrata NOT DETECTED NOT DETECTED Final   Candida krusei NOT DETECTED NOT DETECTED Final   Candida parapsilosis NOT DETECTED NOT DETECTED Final   Candida tropicalis NOT DETECTED NOT DETECTED Final    Comment: Performed at [facility], [address] [place], [place] [address]  Culture, blood (x 2)     Status: None (Preliminary result)   Collection Time: 01/04/19 10:27 PM   Specimen: BLOOD  Result Value Ref Range Status   Specimen Description BLOOD RIGHT ANTECUBITAL  Final   Special Requests   Final    BOTTLES DRAWN AEROBIC AND ANAEROBIC Blood Culture adequate volume   Culture   Final    NO GROWTH 4 DAYS Performed at [facility], [address] [place], [place] [address]    Report Status PENDING  Incomplete  MRSA PCR Screening     Status: None   Collection Time: 01/05/19  4:28 PM   Specimen: Nasal Mucosa; Nasopharyngeal  Result Value Ref Range Status   MRSA by PCR NEGATIVE NEGATIVE Final    Comment:        The GeneXpert MRSA Assay (FDA approved for NASAL specimens only), is one component of a comprehensive MRSA colonization surveillance program. It is not intended to diagnose MRSA infection nor to guide or monitor treatment for MRSA infections. Performed at [facility], [address] [place], [place] [address]       Radiology Studies: No results found.  Scheduled Meds: . enoxaparin (LOVENOX) injection  40 mg Subcutaneous Q24H  . finasteride  5 mg Oral QHS  . levothyroxine  25 mcg Intravenous Daily  . mouth rinse  15  mL Mouth Rinse BID  . metoprolol tartrate  2.5 mg Intravenous Q8H  . OLANZapine zydis  2.5 mg Oral QHS  . pantoprazole (PROTONIX) IV  40 mg Intravenous Q24H  . sodium chloride flush  3 mL Intravenous Q12H   Continuous Infusions: . cefTRIAXone (ROCEPHIN)  IV Stopped (01/08/19 1732)  . dextrose 5 % and 0.9 % NaCl with KCl 40 mEq/L 100 mL/hr at 01/08/19 0800     LOS: 5 days   Time spent: Total 40 minutes spent that includes 13 minutes spent on family conversation.   Hughie Clossavi Greidy Sherard, MD [facility] Pager [phone]  If 7PM-7AM, please contact night-coverage [email or web address] Password [email or web address] 01/09/2019, 2:10 PM

## 2019-01-10 LAB — CBC WITH DIFFERENTIAL/PLATELET
Abs Immature Granulocytes: 0.01 10*3/uL (ref 0.00–0.07)
Basophils Absolute: 0.1 10*3/uL (ref 0.0–0.1)
Basophils Relative: 1 %
Eosinophils Absolute: 0.2 10*3/uL (ref 0.0–0.5)
Eosinophils Relative: 5 %
HCT: 37.3 % — ABNORMAL LOW (ref 39.0–52.0)
Hemoglobin: 12.8 g/dL — ABNORMAL LOW (ref 13.0–17.0)
Immature Granulocytes: 0 %
Lymphocytes Relative: 27 %
Lymphs Abs: 1.2 10*3/uL (ref 0.7–4.0)
MCH: 33 pg (ref 26.0–34.0)
MCHC: 34.3 g/dL (ref 30.0–36.0)
MCV: 96.1 fL (ref 80.0–100.0)
Monocytes Absolute: 0.6 10*3/uL (ref 0.1–1.0)
Monocytes Relative: 14 %
Neutro Abs: 2.4 10*3/uL (ref 1.7–7.7)
Neutrophils Relative %: 53 %
Platelets: 168 10*3/uL (ref 150–400)
RBC: 3.88 MIL/uL — ABNORMAL LOW (ref 4.22–5.81)
RDW: 12.7 % (ref 11.5–15.5)
WBC: 4.5 10*3/uL (ref 4.0–10.5)
nRBC: 0 % (ref 0.0–0.2)

## 2019-01-10 MED ORDER — LEVOTHYROXINE SODIUM 50 MCG PO TABS
50.0000 ug | ORAL_TABLET | Freq: Every day | ORAL | Status: DC
  Administered 2019-01-11 – 2019-01-12 (×2): 50 ug via ORAL
  Filled 2019-01-10 (×2): qty 1

## 2019-01-10 MED ORDER — PANTOPRAZOLE SODIUM 40 MG PO TBEC
40.0000 mg | DELAYED_RELEASE_TABLET | Freq: Every day | ORAL | Status: DC
  Administered 2019-01-11: 09:00:00 40 mg via ORAL
  Filled 2019-01-10 (×2): qty 1

## 2019-01-10 NOTE — Progress Notes (Signed)
SLP Cancellation Note  Patient Details Name: TALBERT TREMBATH MRN: [id number] DOB: 1920-02-21   Cancelled treatment:       Reason Eval/Treat Not Completed: Patient's level of consciousness. Could not wake pt up to eat and drink, RN reports he has been sleeping all morning, no sedation has been given.   Herbie Baltimore, MA CCC-SLP  Acute Rehabilitation Services Pager [phone] Office [phone]  Lynann Beaver 01/10/2019, 10:27 AM

## 2019-01-10 NOTE — Progress Notes (Signed)
[facility]  Continue to follow for possible hospice services at ALF. Chart reviewed and spoke with Shirley Muscat by phone Saturday and Sunday. They confirm disposition to be determined based on [place] assessment Monday. [facility] hospital liaison will follow up tomorrow to see how to best provide services.  Thank you,  Erling Conte, LCSW [phone]  [facility] hospital liaisons are listed daily on AMION under Hospice and [place]

## 2019-01-10 NOTE — Progress Notes (Signed)
PROGRESS NOTE    Casey Andrade  ZOX:[id number] DOB: 06-21-1919 DOA: 01/04/2019 PCP: Catha GosselinLittle, Kevin, MD   Brief Narrative:  Casey Andrade is a 85 y.o. male with medical history significant of hypertension, hypertrophic cardiomyopathy last EF 65 to 70%, dementia, hypothyroidism, hyponatremia, and gait disturbance; who was brought into the ED after being found acutely altered.    Reportedly, he was Previously noted to be lethargic but arousable and speaking 15 minutes prior.  Upon EMS arrival he was initially placed on a nonrebreather and patient appeared flaccid on the left side with some muscle tone reported on the right.  Initially suspected stroke versus aspiration.  Per daughter, patient has had chronic productive cough, but had increased sputum production although reported to be clear in the last week or so.  Otherwise patient had been noted to be in fairly decent health for his age and utilized a rolling walker to ambulate as he was high falls risk.  Reportedly Patient was given Haldol afternoon around 4:30 p.m a day prior.according to daughter, he was previously given Haldol he slept for over 24 hours thereafter.  On admission into the emergency department patient was found to be febrile up to 100.8 F rectally, respirations 16-26, blood pressure noted as 97/45, and O2 saturation maintained currently on 1 L nasal cannula oxygen. CT scan of the brain was negative for any acute abnormalities, and code stroke had been canceled after patient was evaluated by neurology.  Chest x-ray showed no acute abnormalities. Labs significant for WBC 14.8.  COVID-19 negative.  No initial lactic acid was obtained.  Patient was started on empiric antibiotics of vancomycin, metronidazole, and cefepime which were then de-escalated to cefepime and then Rocephin.  Neurology had a long discussion with the family and they prefer not to do MRI for further possible stroke work-up.  For that reason, repeat CT head was done by  neurology which ruled out any stroke.  Palliative care was then consulted per family's request and family has desired for the patient to go to assisted living facility with hospice early next week.  Assessment & Plan:   Principal Problem:   Sepsis (HCC) Active Problems:   HOCM (hypertrophic obstructive cardiomyopathy) (HCC)   Hypothyroidism   Transient hypotension   Encephalopathy   Altered mental status   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 (HCC)   Acute delirium   Palliative care encounter  Acute metabolic encephalopathy vs delirium: Likely multifactorial, could be due to acute infection, aspiration pneumonia versus Haldol.  Patient was looking physically stronger, more alert however he was still slightly confused.  We will continue Zyprexa nightly in agreement with patient's children.  Delirium precautions.  Once again today I talked to primary RN and advised them to keep his room late with curtains raised during the daytime from 9 AM to 9 PM and let him sleep from 9 PM to 7 AM. He was sleepy and lethargic this morning when I saw him.  Possible aspiration pneumonia: chest x-ray suggests no active disease however radiological findings could lag behind clinical findings and he had fever of 100.8 and due to altered mental status, he may have aspirated.  Continue Rocephin for today and then discontinue.  Possible dysphagia: Status post MBS.  SLP on board.  On dysphagia 1 diet.  Dehydration/decreased urine output: Still low urine output but patient clinically is doing fine.  Does not look dehydrated anymore.  I doubt accuracy of I's and O's.   Hypothyroidism:  Continue Synthroid.  History of hypertension with transient hypotension: Blood pressure fairly controlled today.  Continue current management.  DVT prophylaxis: Lovenox Code Status: DNR Family Communication: Plan of care discussed in length with patient son Onalee Hua and daughter-in-law  Lupita Leash, daughter Ivor Costa over the phone yet again today.  We discussed about 15 minutes.  They are requesting hospital bed before we discharge patient. Disposition Plan: Potential discharge in next 24 to 48 hours once DME/hospital bed is arranged.  Consultants:   None  Procedures:   None  Antimicrobials:   Cefepime> 01/06/2019  Rocephin 01/06/2019   Subjective: Patient seen and examined early this morning around 9:15 AM.  He was sleepy and completely confused.  Objective: Vitals:   01/09/19 1000 01/09/19 1404 01/09/19 2120 01/10/19 0546  BP: (!) 116/56 138/83 (!) 156/94 (!) 151/63  Pulse: 74 90 95 (!) 59  Resp:   16 18  Temp:  97.7 F (36.5 C) 98.4 F (36.9 C) 98.3 F (36.8 C)  TempSrc:  Oral Oral Oral  SpO2:  99% 98% 97%  Weight:      Height:        Intake/Output Summary (Last 24 hours) at 01/10/2019 1129 Last data filed at 01/09/2019 1808 Gross per 24 hour  Intake 220 ml  Output --  Net 220 ml   Filed Weights   01/05/19 2308 01/06/19 0348  Weight: 64.9 kg 65.8 kg    Examination: General exam: Sleepy and confused Respiratory system: Clear to auscultation. Respiratory effort normal. Cardiovascular system: S1 & S2 heard, RRR. No JVD, murmurs, rubs, gallops or clicks. No pedal edema. Gastrointestinal system: Abdomen is nondistended, soft and nontender. No organomegaly or masses felt. Normal bowel sounds heard. Central nervous system: Sleepy and confused Extremities: Moving all extremities spontaneously Skin: No rashes, lesions or ulcers.  Psychiatry: Unable to assess due to being sleepy.  Data Reviewed: I have personally reviewed following labs and imaging studies  CBC: Recent Labs  Lab 01/06/19 0214 01/07/19 0150 01/08/19 0143 01/09/19 0226 01/10/19 0337  WBC 6.1 6.2 5.6 5.1 4.5  NEUTROABS 4.3 4.2 3.5 2.9 2.4  HGB 14.2 13.4 13.9 13.9 12.8*  HCT 41.3 40.9 42.7 41.7 37.3*  MCV 98.8 98.6 101.2* 97.9 96.1  PLT 186 204 187 187 168   Basic Metabolic  Panel: Recent Labs  Lab 01/04/19 1300 01/04/19 1308 01/05/19 0646 01/06/19 0214 01/07/19 0150 01/08/19 0143  NA 136 137 137 139 141 140  K 3.8 4.1 3.7 3.7 3.6 3.9  CL 104 101 106 107 110 110  CO2 21*  --  21* 20* 20* 21*  GLUCOSE 118* 116* 103* 71 116* 98  BUN 16 21 19 21 17 11   CREATININE 0.75 0.60* 0.87 0.83 0.76 0.79  CALCIUM 8.7*  --  8.8* 8.6* 8.7* 8.4*  MG  --   --   --  1.9  --   --    GFR: Estimated Creatinine Clearance: 47.6 mL/min (by C-G formula based on SCr of 0.79 mg/dL). Liver Function Tests: Recent Labs  Lab 01/04/19 1300 01/06/19 0214 01/07/19 0150 01/08/19 0143  AST 23 25 39 37  ALT 20 20 21 21   ALKPHOS 76 67 65 63  BILITOT 1.3* 1.3* 0.5 0.5  PROT 5.9* 5.7* 6.0* 5.7*  ALBUMIN 3.3* 3.2* 3.3* 3.2*   No results for input(s): LIPASE, AMYLASE in the last 168 hours. No results for input(s): AMMONIA in the last 168 hours. Coagulation Profile: Recent Labs  Lab 01/04/19 1300  INR 1.0  Cardiac Enzymes: No results for input(s): CKTOTAL, CKMB, CKMBINDEX, TROPONINI in the last 168 hours. BNP (last 3 results) No results for input(s): PROBNP in the last 8760 hours. HbA1C: No results for input(s): HGBA1C in the last 72 hours. CBG: Recent Labs  Lab 01/06/19 0442 01/06/19 0650 01/08/19 1937 01/09/19 0738 01/09/19 1237  GLUCAP 66* 98 113* 87 96   Lipid Profile: No results for input(s): CHOL, HDL, LDLCALC, TRIG, CHOLHDL, LDLDIRECT in the last 72 hours. Thyroid Function Tests: No results for input(s): TSH, T4TOTAL, FREET4, T3FREE, THYROIDAB in the last 72 hours. Anemia Panel: No results for input(s): VITAMINB12, FOLATE, FERRITIN, TIBC, IRON, RETICCTPCT in the last 72 hours. Sepsis Labs: Recent Labs  Lab 01/04/19 1327 01/04/19 1755 01/04/19 2257  PROCALCITON <0.10  --   --   LATICACIDVEN  --  2.1* 1.4    Recent Results (from the past 240 hour(s))  SARS Coronavirus 2 [facility] order, Performed in [facility] hospital lab) Nasopharyngeal  Nasopharyngeal Swab     Status: None   Collection Time: 01/04/19  1:50 PM   Specimen: Nasopharyngeal Swab  Result Value Ref Range Status   SARS Coronavirus 2 NEGATIVE NEGATIVE Final    Comment: (NOTE) If result is NEGATIVE SARS-CoV-2 target nucleic acids are NOT DETECTED. The SARS-CoV-2 RNA is generally detectable in upper and lower  respiratory specimens during the acute phase of infection. The lowest  concentration of SARS-CoV-2 viral copies this assay can detect is 250  copies / mL. A negative result does not preclude SARS-CoV-2 infection  and should not be used as the sole basis for treatment or other  patient management decisions.  A negative result may occur with  improper specimen collection / handling, submission of specimen other  than nasopharyngeal swab, presence of viral mutation(s) within the  areas targeted by this assay, and inadequate number of viral copies  (<250 copies / mL). A negative result must be combined with clinical  observations, patient history, and epidemiological information. If result is POSITIVE SARS-CoV-2 target nucleic acids are DETECTED. The SARS-CoV-2 RNA is generally detectable in upper and lower  respiratory specimens dur ing the acute phase of infection.  Positive  results are indicative of active infection with SARS-CoV-2.  Clinical  correlation with patient history and other diagnostic information is  necessary to determine patient infection status.  Positive results do  not rule out bacterial infection or co-infection with other viruses. If result is PRESUMPTIVE POSTIVE SARS-CoV-2 nucleic acids MAY BE PRESENT.   A presumptive positive result was obtained on the submitted specimen  and confirmed on repeat testing.  While 2019 novel coronavirus  (SARS-CoV-2) nucleic acids may be present in the submitted sample  additional confirmatory testing may be necessary for epidemiological  and / or clinical management purposes  to differentiate between    SARS-CoV-2 and other Sarbecovirus currently known to infect humans.  If clinically indicated additional testing with an alternate test  methodology [phone]) is advised. The SARS-CoV-2 RNA is generally  detectable in upper and lower respiratory sp ecimens during the acute  phase of infection. The expected result is Negative. Fact Sheet for Patients:  StrictlyIdeas.no Fact Sheet for Healthcare Providers: BankingDealers.co.za This test is not yet approved or cleared by the [place] FDA and has been authorized for detection and/or diagnosis of SARS-CoV-2 by FDA under an Emergency Use Authorization (EUA).  This EUA will remain in effect (meaning this test can be used) for the duration of the COVID-19 declaration under Section 564(b)(1) of  the Act, 21 U.S.C. section 360bbb-3(b)(1), unless the authorization is terminated or revoked sooner. Performed at [facility], [address], [place], [place] [address]   Culture, blood (x 2)     Status: Abnormal   Collection Time: 01/04/19  5:55 PM   Specimen: BLOOD  Result Value Ref Range Status   Specimen Description BLOOD RIGHT ANTECUBITAL  Final   Special Requests   Final    BOTTLES DRAWN AEROBIC AND ANAEROBIC Blood Culture results may not be optimal due to an inadequate volume of blood received in culture bottles   Culture  Setup Time   Final    GRAM POSITIVE COCCI ANAEROBIC BOTTLE ONLY CRITICAL RESULT CALLED TO, READ BACK BY AND VERIFIED WITH: T. BAUMEISTER, PHARMD AT 1405 ON 01/06/19 BY C. JESSUP, MT.    Culture (A)  Final    STAPHYLOCOCCUS SPECIES (COAGULASE NEGATIVE) THE SIGNIFICANCE OF ISOLATING THIS ORGANISM FROM A SINGLE SET OF BLOOD CULTURES WHEN MULTIPLE SETS ARE DRAWN IS UNCERTAIN. PLEASE NOTIFY THE MICROBIOLOGY DEPARTMENT WITHIN ONE WEEK IF SPECIATION AND SENSITIVITIES ARE REQUIRED. Performed at [facility], [address], [place], [place] [address]    Report Status  01/08/2019 FINAL  Final  Blood Culture ID Panel (Reflexed)     Status: Abnormal   Collection Time: 01/04/19  5:55 PM  Result Value Ref Range Status   Enterococcus species NOT DETECTED NOT DETECTED Final   Listeria monocytogenes NOT DETECTED NOT DETECTED Final   Staphylococcus species DETECTED (A) NOT DETECTED Final    Comment: Methicillin (oxacillin) susceptible coagulase negative staphylococcus. Possible blood culture contaminant (unless isolated from more than one blood culture draw or clinical case suggests pathogenicity). No antibiotic treatment is indicated for blood  culture contaminants. CRITICAL RESULT CALLED TO, READ BACK BY AND VERIFIED WITH: T. BAUMEISTER, PHARMD AT 1405 ON 01/06/19 BY C. JESSUP, MT.    Staphylococcus aureus (BCID) NOT DETECTED NOT DETECTED Final   Methicillin resistance NOT DETECTED NOT DETECTED Final   Streptococcus species NOT DETECTED NOT DETECTED Final   Streptococcus agalactiae NOT DETECTED NOT DETECTED Final   Streptococcus pneumoniae NOT DETECTED NOT DETECTED Final   Streptococcus pyogenes NOT DETECTED NOT DETECTED Final   Acinetobacter baumannii NOT DETECTED NOT DETECTED Final   Enterobacteriaceae species NOT DETECTED NOT DETECTED Final   Enterobacter cloacae complex NOT DETECTED NOT DETECTED Final   Escherichia coli NOT DETECTED NOT DETECTED Final   Klebsiella oxytoca NOT DETECTED NOT DETECTED Final   Klebsiella pneumoniae NOT DETECTED NOT DETECTED Final   Proteus species NOT DETECTED NOT DETECTED Final   Serratia marcescens NOT DETECTED NOT DETECTED Final   Haemophilus influenzae NOT DETECTED NOT DETECTED Final   Neisseria meningitidis NOT DETECTED NOT DETECTED Final   Pseudomonas aeruginosa NOT DETECTED NOT DETECTED Final   Candida albicans NOT DETECTED NOT DETECTED Final   Candida glabrata NOT DETECTED NOT DETECTED Final   Candida krusei NOT DETECTED NOT DETECTED Final   Candida parapsilosis NOT DETECTED NOT DETECTED Final   Candida tropicalis  NOT DETECTED NOT DETECTED Final    Comment: Performed at [facility], [address] [place], [place] [address]  Culture, blood (x 2)     Status: None   Collection Time: 01/04/19 10:27 PM   Specimen: BLOOD  Result Value Ref Range Status   Specimen Description BLOOD RIGHT ANTECUBITAL  Final   Special Requests   Final    BOTTLES DRAWN AEROBIC AND ANAEROBIC Blood Culture adequate volume   Culture  Final    NO GROWTH 5 DAYS Performed at [facility], [address] [place], [place] [address]    Report Status 01/09/2019 FINAL  Final  MRSA PCR Screening     Status: None   Collection Time: 01/05/19  4:28 PM   Specimen: Nasal Mucosa; Nasopharyngeal  Result Value Ref Range Status   MRSA by PCR NEGATIVE NEGATIVE Final    Comment:        The GeneXpert MRSA Assay (FDA approved for NASAL specimens only), is one component of a comprehensive MRSA colonization surveillance program. It is not intended to diagnose MRSA infection nor to guide or monitor treatment for MRSA infections. Performed at [facility], [address] [place], [place] [address]       Radiology Studies: No results found.  Scheduled Meds:  enoxaparin (LOVENOX) injection  40 mg Subcutaneous Q24H   finasteride  5 mg Oral QHS   levothyroxine  25 mcg Intravenous Daily   mouth rinse  15 mL Mouth Rinse BID   metoprolol tartrate  2.5 mg Intravenous Q8H   OLANZapine zydis  2.5 mg Oral QHS   pantoprazole (PROTONIX) IV  40 mg Intravenous Q24H   sodium chloride flush  3 mL Intravenous Q12H   Continuous Infusions:  cefTRIAXone (ROCEPHIN)  IV 2 g (01/09/19 1636)     LOS: 6 days   Time spent: Total 33 minutes spent that includes 15 minutes spent on family conversation.   Hughie Clossavi Alvar Malinoski, MD [facility] Pager [phone]  If 7PM-7AM, please contact night-coverage [email or web address] Password [email or web address] 01/10/2019, 11:29 AM

## 2019-01-10 NOTE — TOC Progression Note (Addendum)
Transition of Care [facility]) - Progression Note    Patient Details  Name: Casey Andrade MRN: [id number] Date of Birth: 1920-02-24  Transition of Care [facility]) CM/SW Contact  Eileen Stanford, LCSW Phone Number: 01/10/2019, 2:46 PM  Clinical Narrative:   CSW contacted [name] to discuss if ALF can take pt given change in pt's phsyical abilities. [name] states admissions is not in the facility today and request that CSW contact ALF in the morning around 9 to discuss. CSW spoke with daughter Casey Andrade) who was present at bedside. Pt's daughter was understanding and pt's daughter states if pt can not go to ALF at d/c family DOES NOT want pt to return to [name], they would prefer a SNF in [place]. CSW to send pt's referall to SNF's in the [place] area for backup.      Barriers to Discharge: Continued Medical Work up  Expected Discharge Plan and Services     Discharge Planning Services: CM Consult                                           Social Determinants of Health (SDOH) Interventions    Readmission Risk Interventions No flowsheet data found.

## 2019-01-10 NOTE — Care Management (Signed)
Spoke with patient's daughter at bedside and family on speaker phone. Patient was accepted to ALF at "level 2" but would be a Level 3 in current condition. CSW resourced to verify if patient could return there prior to hospice getting DME delivered. ALF will need to be contacted Monday when admissions coordinator is back. Per family they have been in contact w Harmon Pier from [facility]. Patient will need DME hospital bed for DC, equipment will be set up by [facility].

## 2019-01-10 NOTE — NC FL2 (Signed)
[place] LEVEL OF CARE SCREENING TOOL     IDENTIFICATION  Patient Name: Casey Andrade Birthdate: [birth year] Sex: male Admission Date (Current Location): 01/04/2019  [facility] and [place] Number:  Herbalist and Address:  [facility], [place] [address] [place], [place] [address]      Provider Number: [id number]  Attending Physician Name and Address:  Darliss Cheney, MD  Relative Name and Phone Number:       Current Level of Care: Hospital Recommended Level of Care: [place] Prior Approval Number:    Date Approved/Denied:   PASRR Number: [id number]  Discharge Plan: SNF    Current Diagnoses: Patient Active Problem List   Diagnosis Date Noted  . Altered mental status   . 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 ([place])   . Acute delirium   . Palliative care encounter   . Sepsis ([place]) 01/04/2019  . Transient hypotension 01/04/2019  . Encephalopathy 01/04/2019  . Mitral valvular regurgitation 08/17/2016  . HOCM (hypertrophic obstructive cardiomyopathy) ([place]) 09/14/2010  . RBBB (right bundle branch block) 09/14/2010  . Hypothyroidism 09/14/2010  . Benign hypertensive heart disease without heart failure 09/14/2010    Orientation RESPIRATION BLADDER Height & Weight     Self  Normal External catheter, Incontinent(placed 9/14) Weight: 145 lb 1 oz (65.8 kg) Height:  5\' 6"  (167.6 cm)  BEHAVIORAL SYMPTOMS/MOOD NEUROLOGICAL BOWEL NUTRITION STATUS      Continent Diet(DYS 1 diet, honey thick liquids)  AMBULATORY STATUS COMMUNICATION OF NEEDS Skin   Extensive Assist Verbally Normal                       Personal Care Assistance Level of Assistance  Bathing, Feeding, Dressing Bathing Assistance: Maximum assistance Feeding assistance: Limited assistance Dressing Assistance: Maximum assistance     Functional Limitations Info  Sight, Speech, Hearing Sight Info:  Adequate Hearing Info: Adequate Speech Info: Adequate    SPECIAL CARE FACTORS FREQUENCY  PT (By licensed PT), OT (By licensed OT)     PT Frequency: 5x OT Frequency: 5x            Contractures Contractures Info: Not present    Additional Factors Info  Code Status, Allergies Code Status Info: DNR Allergies Info: Ativan (Lorazepam), Augmentin (Amoxicillin-pot Clavulanate), Hydrocodone Bitartrate (Hydrocodone), Neosporin (Bacitracin-polymyxin B)           Current Medications (01/10/2019):  This is the current hospital active medication list Current Facility-Administered Medications  Medication Dose Route Frequency Provider Last Rate Last Dose  . acetaminophen (TYLENOL) tablet 650 mg  650 mg Oral Q6H PRN Fuller Plan A, MD   650 mg at 01/09/19 0000   Or  . acetaminophen (TYLENOL) suppository 650 mg  650 mg Rectal Q6H PRN Smith, Rondell A, MD      . albuterol (PROVENTIL) (2.5 MG/3ML) 0.083% nebulizer solution 2.5 mg  2.5 mg Nebulization Q6H PRN Smith, Rondell A, MD      . cefTRIAXone (ROCEPHIN) 2 g in sodium chloride 0.9 % 100 mL IVPB  2 g Intravenous Q24H Darliss Cheney, MD 200 mL/hr at 01/09/19 1636 2 g at 01/09/19 1636  . enoxaparin (LOVENOX) injection 40 mg  40 mg Subcutaneous Q24H Fuller Plan A, MD   40 mg at 01/10/19 0959  . finasteride (PROSCAR) tablet 5 mg  5 mg Oral QHS Darliss Cheney, MD   5 mg at 01/09/19 2225  . hydrALAZINE (APRESOLINE) injection 10 mg  10 mg Intravenous Q6H PRN Hughie ClossPahwani, Ravi, MD   10 mg at 01/09/19 0834  . [START ON 01/11/2019] levothyroxine (SYNTHROID) tablet 50 mcg  50 mcg Oral Q0600 Hughie ClossPahwani, Ravi, MD      . MEDLINE mouth rinse  15 mL Mouth Rinse BID Katrinka BlazingSmith, Rondell A, MD   15 mL at 01/10/19 1210  . metoprolol tartrate (LOPRESSOR) injection 2.5 mg  2.5 mg Intravenous Q8H Kirby-Graham, Beather ArbourKaren J, NP   2.5 mg at 01/10/19 0620  . OLANZapine zydis (ZYPREXA) disintegrating tablet 2.5 mg  2.5 mg Oral QHS Dellinger, Marianne L, PA-C   2.5 mg at 01/09/19 2226   . ondansetron (ZOFRAN) tablet 4 mg  4 mg Oral Q6H PRN Madelyn FlavorsSmith, Rondell A, MD       Or  . ondansetron (ZOFRAN) injection 4 mg  4 mg Intravenous Q6H PRN Smith, Rondell A, MD      . pantoprazole (PROTONIX) EC tablet 40 mg  40 mg Oral Daily Pahwani, Daleen Boavi, MD      . Resource ThickenUp Clear   Oral PRN Hughie ClossPahwani, Ravi, MD      . sodium chloride flush (NS) 0.9 % injection 3 mL  3 mL Intravenous Q12H Smith, Rondell A, MD   3 mL at 01/10/19 1000     Discharge Medications: Please see discharge summary for a list of discharge medications.  Relevant Imaging Results:  Relevant Lab Results:   Additional Information SSN:[id number]  Reuel BoomBridget A Morton Simson, LCSW

## 2019-01-11 LAB — CBC WITH DIFFERENTIAL/PLATELET
Abs Immature Granulocytes: 0.02 10*3/uL (ref 0.00–0.07)
Basophils Absolute: 0.1 10*3/uL (ref 0.0–0.1)
Basophils Relative: 1 %
Eosinophils Absolute: 0.3 10*3/uL (ref 0.0–0.5)
Eosinophils Relative: 6 %
HCT: 39.2 % (ref 39.0–52.0)
Hemoglobin: 12.7 g/dL — ABNORMAL LOW (ref 13.0–17.0)
Immature Granulocytes: 0 %
Lymphocytes Relative: 26 %
Lymphs Abs: 1.2 10*3/uL (ref 0.7–4.0)
MCH: 32.2 pg (ref 26.0–34.0)
MCHC: 32.4 g/dL (ref 30.0–36.0)
MCV: 99.5 fL (ref 80.0–100.0)
Monocytes Absolute: 0.7 10*3/uL (ref 0.1–1.0)
Monocytes Relative: 14 %
Neutro Abs: 2.5 10*3/uL (ref 1.7–7.7)
Neutrophils Relative %: 53 %
Platelets: 193 10*3/uL (ref 150–400)
RBC: 3.94 MIL/uL — ABNORMAL LOW (ref 4.22–5.81)
RDW: 12.8 % (ref 11.5–15.5)
WBC: 4.7 10*3/uL (ref 4.0–10.5)
nRBC: 0 % (ref 0.0–0.2)

## 2019-01-11 MED ORDER — POLYETHYLENE GLYCOL 3350 17 G PO PACK
17.0000 g | PACK | Freq: Every day | ORAL | Status: DC
  Administered 2019-01-11: 17 g via ORAL
  Filled 2019-01-11: qty 1

## 2019-01-11 MED ORDER — BISACODYL 10 MG RE SUPP
10.0000 mg | Freq: Once | RECTAL | Status: AC
  Administered 2019-01-11: 18:00:00 10 mg via RECTAL
  Filled 2019-01-11: qty 1

## 2019-01-11 NOTE — Progress Notes (Signed)
   Vital Signs MEWS/VS Documentation      01/10/2019 2154 01/10/2019 2230 01/11/2019 0607 01/11/2019 0800   MEWS Score:  1  2  2  2    MEWS Score Color:  Green  Yellow  Yellow  Yellow   Resp:  18  18  18   -   Pulse:  66  73  75  -   BP:  (!) 150/63  131/69  (!) 151/98  -   Temp:  99.1 F (37.3 C)  98.4 F (36.9 C)  97.8 F (36.6 C)  -   O2 Device:  Room Air  Room Air  Room Air  -   Level of Consciousness:  -  -  -  Responds to Pain      No acute change in patient's condition at this time.  Will continue to monitor     [employer] 01/11/2019,10:15 AM

## 2019-01-11 NOTE — Progress Notes (Signed)
  Speech Language Pathology Treatment: Dysphagia  Patient Details Name: Casey Andrade MRN: [id number] DOB: 1919/07/21 Today's Date: 01/11/2019 Time: 1121-1201 SLP Time Calculation (min) (ACUTE ONLY): 40 min  Assessment / Plan / Recommendation Clinical Impression  Pt much more awake and alert today.  Daughter was at bedside feeding pt puree snack.  Provided trials of nectar thick liquid which pt tolerated with no clinical s/s of aspiration. Pt consumed around 8 oz of NTL with SLP present.  With trials of regular solid, pt appeared to be tolerating well, but trials were followed by belching, wet vocal quality, strong cough, and watery eyes.  Pt expectorated frothy secretions.  Pt noted to continue to cough up secretions very delayed to PO intake.  Pt tolerated soft solids (graham cracker with puree) without difficulty.  Suspect esophageal involvement contributing to symptoms.  Reviewed MBSS imaging from 9/17 with daughter and answered family questions (son Shanon Brow and daughter in law called in at end of session - their daughter Adonis Brook is an SLP at the [place] [employer] in [place]).  OF NOTE:  Pt appears to have CP Bar, per imaging from 01/07/19.  Recommend GI and/or ENT referral to determine if this can be addressed. Recommend advancing to chopped/ground (dysphagia 2) diet with nectar thick liquids.    HPI HPI: Casey Andrade is a 85 y.o. male with medical history significant of hypertension, hypertrophic cardiomyopathy last EF 65 to 70%, dementia, hypothyroidism, hyponatremia, and gait disturbance; who presents after being found acutely altered.  Pt's daughter reported that pt has a baseline productive cough, but sputum production has increased in the last week.  Pt initially appeared to be flaccid on L side; however, head CT was clear.  CXR on  9/14 was unremarkable.        SLP Plan  Continue with current plan of care       Recommendations  Diet recommendations: Dysphagia 2 (fine chop);Nectar-thick  liquid Liquids provided via: Cup Medication Administration: Crushed with puree Supervision: Full supervision/cueing for compensatory strategies Compensations: Slow rate;Small sips/bites Postural Changes and/or Swallow Maneuvers: Seated upright 90 degrees;Upright 30-60 min after meal                General recommendations: Other(comment)(GI and or ENT referral for possible CP Bar/esophageal dysmotility/dysphagia) Oral Care Recommendations: Oral care BID Follow up Recommendations: Skilled Nursing facility;24 hour supervision/assistance SLP Visit Diagnosis: Dysphagia, oropharyngeal phase (R13.12) Plan: Continue with current plan of care       [place], [place], [place] Office: [phone] 01/11/2019, 12:34 PM

## 2019-01-11 NOTE — Progress Notes (Signed)
PROGRESS NOTE    Casey Andrade M Andrade  RUE:[id number] DOB: [birth year] DOA: 01/04/2019 PCP: Catha GosselinLittle, Kevin, MD   Brief Narrative:  Casey Andrade M Andrade is a 85 y.o. male with medical history significant of hypertension, hypertrophic cardiomyopathy last EF 65 to 70%, dementia, hypothyroidism, hyponatremia, and gait disturbance; who was brought into the ED after being found acutely altered.    Reportedly, he was Previously noted to be lethargic but arousable and speaking 15 minutes prior.  Upon EMS arrival he was initially placed on a nonrebreather and patient appeared flaccid on the left side with some muscle tone reported on the right.  Initially suspected stroke versus aspiration.  Per daughter, patient has had chronic productive cough, but had increased sputum production although reported to be clear in the last week or so.  Otherwise patient had been noted to be in fairly decent health for his age and utilized a rolling walker to ambulate as he was high falls risk.  Reportedly Patient was given Haldol afternoon around 4:30 p.m a day prior.according to daughter, he was previously given Haldol he slept for over 24 hours thereafter.  On admission into the emergency department patient was found to be febrile up to 100.8 F rectally, respirations 16-26, blood pressure noted as 97/45, and O2 saturation maintained currently on 1 L nasal cannula oxygen. CT scan of the brain was negative for any acute abnormalities, and code stroke had been canceled after patient was evaluated by neurology.  Chest x-ray showed no acute abnormalities. Labs significant for WBC 14.8.  COVID-19 negative.  No initial lactic acid was obtained.  Patient was started on empiric antibiotics of vancomycin, metronidazole, and cefepime which were then de-escalated to cefepime and then Rocephin.  Neurology had a long discussion with the family and they prefer not to do MRI for further possible stroke work-up.  For that reason, repeat CT head was done by  neurology which ruled out any stroke.  Palliative care was then consulted per family's request and family has desired for the patient to go to assisted living facility with hospice early next week.  Assessment & Plan:   Principal Problem:   Sepsis (HCC) Active Problems:   HOCM (hypertrophic obstructive cardiomyopathy) (HCC)   Hypothyroidism   Transient hypotension   Encephalopathy   Altered mental status   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 (HCC)   Acute delirium   Palliative care encounter  Acute metabolic encephalopathy vs delirium: Likely multifactorial, could be due to acute infection, aspiration pneumonia versus Haldol.  Patient has improved significantly.  He is alert and oriented x4 today.  He had a very good sleep yesterday.  Continue current measures to treat and prevent delirium.  Possible aspiration pneumonia: Completed course of Rocephin.  Possible dysphagia: Status post MBS.  SLP on board.  On dysphagia 1 diet.  Dehydration/decreased urine output: Still low urine output but patient clinically is doing fine.  Does not look dehydrated anymore.  I doubt accuracy of I's and O's.   Hypothyroidism: Continue Synthroid.  History of hypertension with transient hypotension: Blood pressure fairly controlled today.  Continue current management.  DVT prophylaxis: Lovenox Code Status: DNR Family Communication: Plan of care discussed in length with patient son Casey HuaDavid and daughter-in-law Casey Andrade over the phone again.  Case manager working on arranging hospital bed.  Family would like discharge tomorrow instead of today. Disposition Plan: Potential discharge tomorrow.  Consultants:   None  Procedures:   None  Antimicrobials:   Cefepime> 01/06/2019  Rocephin 01/06/2019> 01/10/2019   Subjective: Patient seen and examined this morning with the nurse being present at bedside.  Patient was eating his breakfast.  He was alert and oriented  x4.  Did not have any complaint. Objective: Vitals:   01/10/19 1340 01/10/19 2154 01/10/19 2230 01/11/19 0607  BP:  (!) 150/63 131/69 (!) 151/98  Pulse: (!) 59 66 73 75  Resp:  18 18 18   Temp:  99.1 F (37.3 C) 98.4 F (36.9 C) 97.8 F (36.6 C)  TempSrc:   Oral   SpO2:  94% 97% 97%  Weight:    60.9 kg  Height:        Intake/Output Summary (Last 24 hours) at 01/11/2019 1050 Last data filed at 01/11/2019 0634 Gross per 24 hour  Intake 220 ml  Output 350 ml  Net -130 ml   Filed Weights   01/05/19 2308 01/06/19 0348 01/11/19 0607  Weight: 64.9 kg 65.8 kg 60.9 kg    Examination: General exam: Appears calm and comfortable  Respiratory system: Clear to auscultation. Respiratory effort normal. Cardiovascular system: S1 & S2 heard, RRR. No JVD, murmurs, rubs, gallops or clicks. No pedal edema. Gastrointestinal system: Abdomen is nondistended, soft and nontender. No organomegaly or masses felt. Normal bowel sounds heard. Central nervous system: Alert and oriented. No focal neurological deficits. Extremities: Symmetric 5 x 5 power. Skin: No rashes, lesions or ulcers.  Psychiatry: Judgement and insight appear poor.  Mood & affect appropriate.    Data Reviewed: I have personally reviewed following labs and imaging studies  CBC: Recent Labs  Lab 01/07/19 0150 01/08/19 0143 01/09/19 0226 01/10/19 0337 01/11/19 0235  WBC 6.2 5.6 5.1 4.5 4.7  NEUTROABS 4.2 3.5 2.9 2.4 2.5  HGB 13.4 13.9 13.9 12.8* 12.7*  HCT 40.9 42.7 41.7 37.3* 39.2  MCV 98.6 101.2* 97.9 96.1 99.5  PLT 204 187 187 168 193   Basic Metabolic Panel: Recent Labs  Lab 01/04/19 1300 01/04/19 1308 01/05/19 0646 01/06/19 0214 01/07/19 0150 01/08/19 0143  NA 136 137 137 139 141 140  K 3.8 4.1 3.7 3.7 3.6 3.9  CL 104 101 106 107 110 110  CO2 21*  --  21* 20* 20* 21*  GLUCOSE 118* 116* 103* 71 116* 98  BUN 16 21 19 21 17 11   CREATININE 0.75 0.60* 0.87 0.83 0.76 0.79  CALCIUM 8.7*  --  8.8* 8.6* 8.7* 8.4*    MG  --   --   --  1.9  --   --    GFR: Estimated Creatinine Clearance: 45.5 mL/min (by C-G formula based on SCr of 0.79 mg/dL). Liver Function Tests: Recent Labs  Lab 01/04/19 1300 01/06/19 0214 01/07/19 0150 01/08/19 0143  AST 23 25 39 37  ALT 20 20 21 21   ALKPHOS 76 67 65 63  BILITOT 1.3* 1.3* 0.5 0.5  PROT 5.9* 5.7* 6.0* 5.7*  ALBUMIN 3.3* 3.2* 3.3* 3.2*   No results for input(s): LIPASE, AMYLASE in the last 168 hours. No results for input(s): AMMONIA in the last 168 hours. Coagulation Profile: Recent Labs  Lab 01/04/19 1300  INR 1.0   Cardiac Enzymes: No results for input(s): CKTOTAL, CKMB, CKMBINDEX, TROPONINI in the last 168 hours. BNP (last 3 results) No results for input(s): PROBNP in the last 8760 hours. HbA1C: No results for input(s): HGBA1C in the last 72 hours. CBG: Recent Labs  Lab 01/06/19 0442 01/06/19 0650 01/08/19 1937 01/09/19 0738 01/09/19 1237  GLUCAP 66* 98 113* 87 96  Lipid Profile: No results for input(s): CHOL, HDL, LDLCALC, TRIG, CHOLHDL, LDLDIRECT in the last 72 hours. Thyroid Function Tests: No results for input(s): TSH, T4TOTAL, FREET4, T3FREE, THYROIDAB in the last 72 hours. Anemia Panel: No results for input(s): VITAMINB12, FOLATE, FERRITIN, TIBC, IRON, RETICCTPCT in the last 72 hours. Sepsis Labs: Recent Labs  Lab 01/04/19 1327 01/04/19 1755 01/04/19 2257  PROCALCITON <0.10  --   --   LATICACIDVEN  --  2.1* 1.4    Recent Results (from the past 240 hour(s))  SARS Coronavirus 2 [facility] order, Performed in [facility] hospital lab) Nasopharyngeal Nasopharyngeal Swab     Status: None   Collection Time: 01/04/19  1:50 PM   Specimen: Nasopharyngeal Swab  Result Value Ref Range Status   SARS Coronavirus 2 NEGATIVE NEGATIVE Final    Comment: (NOTE) If result is NEGATIVE SARS-CoV-2 target nucleic acids are NOT DETECTED. The SARS-CoV-2 RNA is generally detectable in upper and lower  respiratory specimens during the acute  phase of infection. The lowest  concentration of SARS-CoV-2 viral copies this assay can detect is 250  copies / mL. A negative result does not preclude SARS-CoV-2 infection  and should not be used as the sole basis for treatment or other  patient management decisions.  A negative result may occur with  improper specimen collection / handling, submission of specimen other  than nasopharyngeal swab, presence of viral mutation(s) within the  areas targeted by this assay, and inadequate number of viral copies  (<250 copies / mL). A negative result must be combined with clinical  observations, patient history, and epidemiological information. If result is POSITIVE SARS-CoV-2 target nucleic acids are DETECTED. The SARS-CoV-2 RNA is generally detectable in upper and lower  respiratory specimens dur ing the acute phase of infection.  Positive  results are indicative of active infection with SARS-CoV-2.  Clinical  correlation with patient history and other diagnostic information is  necessary to determine patient infection status.  Positive results do  not rule out bacterial infection or co-infection with other viruses. If result is PRESUMPTIVE POSTIVE SARS-CoV-2 nucleic acids MAY BE PRESENT.   A presumptive positive result was obtained on the submitted specimen  and confirmed on repeat testing.  While 2019 novel coronavirus  (SARS-CoV-2) nucleic acids may be present in the submitted sample  additional confirmatory testing may be necessary for epidemiological  and / or clinical management purposes  to differentiate between  SARS-CoV-2 and other Sarbecovirus currently known to infect humans.  If clinically indicated additional testing with an alternate test  methodology [phone]) is advised. The SARS-CoV-2 RNA is generally  detectable in upper and lower respiratory sp ecimens during the acute  phase of infection. The expected result is Negative. Fact Sheet for Patients:   StrictlyIdeas.no Fact Sheet for Healthcare Providers: BankingDealers.co.za This test is not yet approved or cleared by the [place] FDA and has been authorized for detection and/or diagnosis of SARS-CoV-2 by FDA under an Emergency Use Authorization (EUA).  This EUA will remain in effect (meaning this test can be used) for the duration of the COVID-19 declaration under Section 564(b)(1) of the Act, 21 U.S.C. section 360bbb-3(b)(1), unless the authorization is terminated or revoked sooner. Performed at [facility], [place] [address], [place], [place] [address]   Culture, blood (x 2)     Status: Abnormal   Collection Time: 01/04/19  5:55 PM   Specimen: BLOOD  Result Value Ref Range Status   Specimen Description BLOOD RIGHT ANTECUBITAL  Final   Special Requests   Final    BOTTLES DRAWN AEROBIC AND ANAEROBIC Blood Culture results may not be optimal due to an inadequate volume of blood received in culture bottles   Culture  Setup Time   Final    GRAM POSITIVE COCCI ANAEROBIC BOTTLE ONLY CRITICAL RESULT CALLED TO, READ BACK BY AND VERIFIED WITH: T. BAUMEISTER, PHARMD AT 1405 ON 01/06/19 BY C. JESSUP, MT.    Culture (A)  Final    STAPHYLOCOCCUS SPECIES (COAGULASE NEGATIVE) THE SIGNIFICANCE OF ISOLATING THIS ORGANISM FROM A SINGLE SET OF BLOOD CULTURES WHEN MULTIPLE SETS ARE DRAWN IS UNCERTAIN. PLEASE NOTIFY THE MICROBIOLOGY DEPARTMENT WITHIN ONE WEEK IF SPECIATION AND SENSITIVITIES ARE REQUIRED. Performed at [facility], [address] [place], [place] [address]    Report Status 01/08/2019 FINAL  Final  Blood Culture ID Panel (Reflexed)     Status: Abnormal   Collection Time: 01/04/19  5:55 PM  Result Value Ref Range Status   Enterococcus species NOT DETECTED NOT DETECTED Final   Listeria monocytogenes NOT DETECTED NOT DETECTED Final   Staphylococcus species DETECTED (A) NOT DETECTED Final    Comment: Methicillin (oxacillin)  susceptible coagulase negative staphylococcus. Possible blood culture contaminant (unless isolated from more than one blood culture draw or clinical case suggests pathogenicity). No antibiotic treatment is indicated for blood  culture contaminants. CRITICAL RESULT CALLED TO, READ BACK BY AND VERIFIED WITH: T. BAUMEISTER, PHARMD AT 1405 ON 01/06/19 BY C. JESSUP, MT.    Staphylococcus aureus (BCID) NOT DETECTED NOT DETECTED Final   Methicillin resistance NOT DETECTED NOT DETECTED Final   Streptococcus species NOT DETECTED NOT DETECTED Final   Streptococcus agalactiae NOT DETECTED NOT DETECTED Final   Streptococcus pneumoniae NOT DETECTED NOT DETECTED Final   Streptococcus pyogenes NOT DETECTED NOT DETECTED Final   Acinetobacter baumannii NOT DETECTED NOT DETECTED Final   Enterobacteriaceae species NOT DETECTED NOT DETECTED Final   Enterobacter cloacae complex NOT DETECTED NOT DETECTED Final   Escherichia coli NOT DETECTED NOT DETECTED Final   Klebsiella oxytoca NOT DETECTED NOT DETECTED Final   Klebsiella pneumoniae NOT DETECTED NOT DETECTED Final   Proteus species NOT DETECTED NOT DETECTED Final   Serratia marcescens NOT DETECTED NOT DETECTED Final   Haemophilus influenzae NOT DETECTED NOT DETECTED Final   Neisseria meningitidis NOT DETECTED NOT DETECTED Final   Pseudomonas aeruginosa NOT DETECTED NOT DETECTED Final   Candida albicans NOT DETECTED NOT DETECTED Final   Candida glabrata NOT DETECTED NOT DETECTED Final   Candida krusei NOT DETECTED NOT DETECTED Final   Candida parapsilosis NOT DETECTED NOT DETECTED Final   Candida tropicalis NOT DETECTED NOT DETECTED Final    Comment: Performed at [facility], [address] [place], [place] [address]  Culture, blood (x 2)     Status: None   Collection Time: 01/04/19 10:27 PM   Specimen: BLOOD  Result Value Ref Range Status   Specimen Description BLOOD RIGHT ANTECUBITAL  Final   Special Requests   Final    BOTTLES DRAWN AEROBIC  AND ANAEROBIC Blood Culture adequate volume   Culture   Final    NO GROWTH 5 DAYS Performed at [facility], [address] [place], [place] [address]    Report Status 01/09/2019 FINAL  Final  MRSA PCR Screening     Status: None   Collection Time: 01/05/19  4:28 PM   Specimen: Nasal Mucosa; Nasopharyngeal  Result Value Ref Range Status   MRSA by  PCR NEGATIVE NEGATIVE Final    Comment:        The GeneXpert MRSA Assay (FDA approved for NASAL specimens only), is one component of a comprehensive MRSA colonization surveillance program. It is not intended to diagnose MRSA infection nor to guide or monitor treatment for MRSA infections. Performed at [facility], [address], [place], [place] [address]       Radiology Studies: No results found.  Scheduled Meds:  enoxaparin (LOVENOX) injection  40 mg Subcutaneous Q24H   finasteride  5 mg Oral QHS   levothyroxine  50 mcg Oral Q0600   mouth rinse  15 mL Mouth Rinse BID   metoprolol tartrate  2.5 mg Intravenous Q8H   OLANZapine zydis  2.5 mg Oral QHS   pantoprazole  40 mg Oral Daily   sodium chloride flush  3 mL Intravenous Q12H   Continuous Infusions:  cefTRIAXone (ROCEPHIN)  IV 2 g (01/10/19 1617)     LOS: 7 days   Time spent: Total 30 minutes spent that includes 8 minutes spent on family conversation.  Hughie Closs, MD [facility] Pager [phone]  If 7PM-7AM, please contact night-coverage [email or web address] Password [email or web address] 01/11/2019, 10:50 AM

## 2019-01-11 NOTE — Progress Notes (Signed)
   Vital Signs MEWS/VS Documentation      01/10/2019 2230 01/11/2019 0607 01/11/2019 0800 01/11/2019 1512   MEWS Score:  2  2  2  2    MEWS Score Color:  Yellow  Yellow  Yellow  Yellow   Resp:  18  18  -  16   Pulse:  73  75  -  74   BP:  131/69  (!) 151/98  -  (!) 147/71   Temp:  98.4 F (36.9 C)  97.8 F (36.6 C)  -  97.6 F (36.4 C)   O2 Device:  Room Air  Room Air  -  Room Air   Level of Consciousness:  -  -  Responds to Pain  -      No acute change in patient's condition at this time     Rance Muir 01/11/2019,5:02 PM

## 2019-01-11 NOTE — Progress Notes (Signed)
Physical Therapy Treatment Patient Details Name: Casey Andrade MRN: [id number] DOB: 11-08-1919 Today's Date: 01/11/2019    History of Present Illness 85yo male with AMS, unresponsive and not following commands, lethargic. BP 97/45 in ED, L UE flaccidity noted but imaging negative for CVA. Admitted for SIRS/sepsis, transient hypotension, and acute encephalopathy. PMH HTN hypothyroidism, R bundle branch block    PT Comments    Pt continues to have decr awareness of where vertical is in regards to his body in space resulting in heavy posterior leaning when trying to stand.    Follow Up Recommendations  Other (comment);Supervision/Assistance - 24 hour([facility] with in house skilled therapy services (if Hospice allows/approves) and facility can provide needed assistance)     Equipment Recommendations  Other (comment)(defer to next venue)    Recommendations for Other Services       Precautions / Restrictions Precautions Precautions: Fall Restrictions Weight Bearing Restrictions: No    Mobility  Bed Mobility Overal bed mobility: Needs Assistance Bed Mobility: Supine to Sit;Rolling Rolling: Max assist   Supine to sit: Max assist     General bed mobility comments: Assist to bring legs off of bed, elevate trunk into sitting and bring hips to EOB.   Transfers Overall transfer level: Needs assistance Equipment used: Rolling walker (2 wheeled);Ambulation equipment used Transfers: Sit to/from Stand;Lateral/Scoot Transfers Sit to Stand: +2 physical assistance;Total assist        Lateral/Scoot Transfers: +2 physical assistance;Total assist General transfer comment: Worked on standing mulitiple times (8-10) with rolling walker, then Stedy, then back to rolling walker. When coming to stand pt has heavy posterior lean and begins stepping, sliding feet forward causing him to lean more posteriorly and he is never able to get his feet underneath his center of gravity. After multiple  attempts with walker and stedy dropped arm of recliner and performed lateral slide using bed pad to slide.   Ambulation/Gait             General Gait Details: unable   Stairs             Wheelchair Mobility    Modified Rankin (Stroke Patients Only)       Balance Overall balance assessment: Needs assistance;History of Falls Sitting-balance support: Bilateral upper extremity supported;Feet supported Sitting balance-Leahy Scale: Poor Sitting balance - Comments: min assist and frequent cue to put feet down on floor and lean forward   Standing balance support: Bilateral upper extremity supported;During functional activity Standing balance-Leahy Scale: Zero Standing balance comment: +2 total for partial upright                            Cognition Arousal/Alertness: Awake/alert Behavior During Therapy: Impulsive Overall Cognitive Status: History of cognitive impairments - at baseline                                        Exercises      General Comments        Pertinent Vitals/Pain Pain Assessment: Faces Faces Pain Scale: No hurt    Home Living                      Prior Function            PT Goals (current goals can now be found in the care plan section) Acute Rehab PT Goals  Patient Stated Goal: get into [facility] so he can be with his wife Progress towards PT goals: Not progressing toward goals - comment    Frequency    Min 2X/week      PT Plan Current plan remains appropriate    Co-evaluation              AM-PAC PT "6 Clicks" Mobility   Outcome Measure  Help needed turning from your back to your side while in a flat bed without using bedrails?: Total Help needed moving from lying on your back to sitting on the side of a flat bed without using bedrails?: Total Help needed moving to and from a bed to a chair (including a wheelchair)?: Total Help needed standing up from a chair using your arms  (e.g., wheelchair or bedside chair)?: Total Help needed to walk in hospital room?: Total Help needed climbing 3-5 steps with a railing? : Total 6 Click Score: 6    End of Session Equipment Utilized During Treatment: Gait belt Activity Tolerance: Patient tolerated treatment well Patient left: in chair;with call bell/phone within reach;with chair alarm set;with family/visitor present Nurse Communication: Mobility status(Nurse assisted with  all mobility and transfer) PT Visit Diagnosis: Unsteadiness on feet (R26.81);Muscle weakness (generalized) (M62.81);Repeated falls (R29.6);Difficulty in walking, not elsewhere classified (R26.2)     Time: 4259-5638 PT Time Calculation (min) (ACUTE ONLY): 32 min  Charges:  $Therapeutic Activity: 23-37 mins                     [place] Pager [phone] Office [place] 01/11/2019, 2:24 PM

## 2019-01-11 NOTE — [place] FL2 (Signed)
[place] MEDICAID FL2 LEVEL OF CARE SCREENING TOOL     IDENTIFICATION  Patient Name: Casey Andrade Birthdate: 18-Aug-1919 Sex: male Admission Date (Current Location): 01/04/2019  [facility] and [place] Number:  Producer, television/film/video and Address:  [facility] [place], [address] [place], [place] [address]      Provider Number: [id number]  Attending Physician Name and Address:  Hughie Closs, MD  Relative Name and Phone Number:       Current Level of Care: Hospital Recommended Level of Care: Assisted Living Facility Prior Approval Number:    Date Approved/Denied:   PASRR Number: [id number]  Discharge Plan: Other (Comment)(ALF)    Current Diagnoses: Patient Active Problem List   Diagnosis Date Noted  . Altered mental status   . 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 (HCC)   . Acute delirium   . Palliative care encounter   . Sepsis (HCC) 01/04/2019  . Transient hypotension 01/04/2019  . Encephalopathy 01/04/2019  . Mitral valvular regurgitation 08/17/2016  . HOCM (hypertrophic obstructive cardiomyopathy) (HCC) 09/14/2010  . RBBB (right bundle branch block) 09/14/2010  . Hypothyroidism 09/14/2010  . Benign hypertensive heart disease without heart failure 09/14/2010    Orientation RESPIRATION BLADDER Height & Weight     Self  Normal External catheter, Incontinent(placed 9/14) Weight: 60.9 kg Height:  5\' 6"  (167.6 cm)  BEHAVIORAL SYMPTOMS/MOOD NEUROLOGICAL BOWEL NUTRITION STATUS      Continent Diet(Dyphagia 2, fine chopped, nectar thick liquids)  AMBULATORY STATUS COMMUNICATION OF NEEDS Skin   Extensive Assist Verbally Normal                       Personal Care Assistance Level of Assistance  Bathing, Feeding, Dressing Bathing Assistance: Maximum assistance Feeding assistance: Limited assistance Dressing Assistance: Maximum assistance     Functional Limitations Info  Sight, Speech, Hearing  Sight Info: Adequate Hearing Info: Adequate Speech Info: Adequate    SPECIAL CARE FACTORS FREQUENCY                       Contractures Contractures Info: Not present    Additional Factors Info  Code Status, Allergies Code Status Info: DNR Allergies Info: Ativan (Lorazepam), Augmentin (Amoxicillin-pot Clavulanate), Hydrocodone Bitartrate (Hydrocodone), Neosporin (Bacitracin-polymyxin B)           Current Medications (01/11/2019):  This is the current hospital active medication list Current Facility-Administered Medications  Medication Dose Route Frequency Provider Last Rate Last Dose  . acetaminophen (TYLENOL) tablet 650 mg  650 mg Oral Q6H PRN 01/13/2019 A, MD   650 mg at 01/09/19 0000   Or  . acetaminophen (TYLENOL) suppository 650 mg  650 mg Rectal Q6H PRN Smith, Rondell A, MD      . albuterol (PROVENTIL) (2.5 MG/3ML) 0.083% nebulizer solution 2.5 mg  2.5 mg Nebulization Q6H PRN Smith, Rondell A, MD      . cefTRIAXone (ROCEPHIN) 2 g in sodium chloride 0.9 % 100 mL IVPB  2 g Intravenous Q24H Pahwani, 01/11/19, MD 200 mL/hr at 01/10/19 1617 2 g at 01/10/19 1617  . enoxaparin (LOVENOX) injection 40 mg  40 mg Subcutaneous Q24H 01/12/19, Rondell A, MD   40 mg at 01/11/19 0857  . finasteride (PROSCAR) tablet 5 mg  5 mg Oral QHS 01/13/19, MD   5 mg at 01/10/19 2134  . hydrALAZINE (APRESOLINE) injection 10 mg  10 mg Intravenous Q6H PRN 2135, MD  10 mg at 01/09/19 0834  . levothyroxine (SYNTHROID) tablet 50 mcg  50 mcg Oral Q0600 Darliss Cheney, MD   50 mcg at 01/11/19 0625  . MEDLINE mouth rinse  15 mL Mouth Rinse BID Tamala Julian, Rondell A, MD   15 mL at 01/10/19 2200  . metoprolol tartrate (LOPRESSOR) injection 2.5 mg  2.5 mg Intravenous Q8H Kirby-Graham, Karsten Fells, NP   2.5 mg at 01/10/19 2134  . OLANZapine zydis (ZYPREXA) disintegrating tablet 2.5 mg  2.5 mg Oral QHS Dellinger, Marianne L, PA-C   2.5 mg at 01/10/19 2136  . ondansetron (ZOFRAN) tablet 4 mg  4 mg Oral Q6H PRN  Fuller Plan A, MD       Or  . ondansetron (ZOFRAN) injection 4 mg  4 mg Intravenous Q6H PRN Smith, Rondell A, MD      . pantoprazole (PROTONIX) EC tablet 40 mg  40 mg Oral Daily Darliss Cheney, MD   40 mg at 01/11/19 0857  . Resource ThickenUp Clear   Oral PRN Darliss Cheney, MD      . sodium chloride flush (NS) 0.9 % injection 3 mL  3 mL Intravenous Q12H Tamala Julian, Rondell A, MD   3 mL at 01/10/19 2135     Discharge Medications: Please see discharge summary for a list of discharge medications.  Relevant Imaging Results:  Relevant Lab Results:   Additional Information [id number]    Hospice to follow @ ALF  Landry Mellow Daivd Council, RN

## 2019-01-11 NOTE — Progress Notes (Signed)
NCM received call from pt's son Shanon Brow. David informed NCM family would like to change hospice provider to [employer]. NCM left voice message with Stanton Kidney Ann/ Manufacturing engineer of change. Referral made with Bobby Rumpf @ [phone] for home hospice care. Nurse Janett Billow) from [facility] to do assessment today @ bedside. NCM faxed FL-2 , and PT evaluation  to [facility] @ [phone].  NCM will continue to monitor for TOC needs. Whitman Hero RN,BSN,CM [phone]

## 2019-01-11 NOTE — Progress Notes (Signed)
   Vital Signs MEWS/VS Documentation      01/10/2019 2154 01/10/2019 2230 01/11/2019 0607 01/11/2019 0800   MEWS Score:  1  2  2  2    MEWS Score Color:  Green  Yellow  Yellow  Yellow   Resp:  18  18  18   -   Pulse:  66  73  75  -   BP:  (!) 150/63  131/69  (!) 151/98  -   Temp:  99.1 F (37.3 C)  98.4 F (36.9 C)  97.8 F (36.6 C)  -   O2 Device:  Room Air  Room Air  Room Air  -   Level of Consciousness:  -  -  -  Responds to Pain    No acute change in patients condition at this time.  Will continue to monitor       Casey Andrade  Casey Andrade 01/11/2019,1:59 PM

## 2019-01-12 LAB — CBC WITH DIFFERENTIAL/PLATELET
Abs Immature Granulocytes: 0.02 10*3/uL (ref 0.00–0.07)
Basophils Absolute: 0 10*3/uL (ref 0.0–0.1)
Basophils Relative: 0 %
Eosinophils Absolute: 0.1 10*3/uL (ref 0.0–0.5)
Eosinophils Relative: 1 %
HCT: 39.1 % (ref 39.0–52.0)
Hemoglobin: 12.9 g/dL — ABNORMAL LOW (ref 13.0–17.0)
Immature Granulocytes: 0 %
Lymphocytes Relative: 9 %
Lymphs Abs: 0.9 10*3/uL (ref 0.7–4.0)
MCH: 32.3 pg (ref 26.0–34.0)
MCHC: 33 g/dL (ref 30.0–36.0)
MCV: 97.8 fL (ref 80.0–100.0)
Monocytes Absolute: 0.9 10*3/uL (ref 0.1–1.0)
Monocytes Relative: 9 %
Neutro Abs: 7.6 10*3/uL (ref 1.7–7.7)
Neutrophils Relative %: 81 %
Platelets: 188 10*3/uL (ref 150–400)
RBC: 4 MIL/uL — ABNORMAL LOW (ref 4.22–5.81)
RDW: 12.9 % (ref 11.5–15.5)
WBC: 9.5 10*3/uL (ref 4.0–10.5)
nRBC: 0 % (ref 0.0–0.2)

## 2019-01-12 MED ORDER — OLANZAPINE 5 MG PO TBDP: 2.5000 mg | ORAL_TABLET | Freq: Two times a day (BID) | ORAL | 0 refills | Status: DC | PRN

## 2019-01-12 NOTE — TOC Transition Note (Addendum)
Transition of Care [facility]) - CM/SW Discharge Note   Patient Details  Name: KWAKU MOSTAFA MRN: [id number] Date of Birth: 1919/09/11  Transition of [facility]) CM/SW Contact:  Sharin Mons, RN Phone Number: 01/12/2019, 8:58 AM   Clinical Narrative:    [name] Anticipated DC date: 01/12/2019 Family notified: David(son) Transport by: Corey Harold   Per MD patient ready for DC to [facility] with home hospice care provided by [facility]. RN,  patient's family, and facility notified of DC. Discharge Summary faxed and FL2 sent to facility. RN to call report prior to discharge [phone]). DC packet on chart. Ambulance transport requested for patient.   RNCM will sign off for now as intervention is no longer needed. Please consult [place] again if new needs arise.    Final next level of care: Assisted Living(with hospice care) Barriers to Discharge: No Barriers Identified   Patient Goals and CMS Choice        Discharge Placement                       Discharge Plan and Services   Discharge Planning Services: CM Consult                                 Social Determinants of Health (SDOH) Interventions     Readmission Risk Interventions No flowsheet data found.

## 2019-01-12 NOTE — Discharge Instructions (Signed)
Hospice Hospice is a service that is designed to provide people who are terminally ill and their families with medical, spiritual, and psychological support. Its aim is to improve your quality of life by keeping you as comfortable as possible in the final stages of life. Who will be my providers when I begin hospice care? Hospice teams often include:  A nurse.  A doctor. The hospice doctor will be available for your care, but you can include your regular doctor or nurse practitioner.  A Education officer, museum.  A counselor.  A religious leader (such as a Clinical biochemist).  A dietitian.  Therapists.  Trained volunteers who can help with care. What services does hospice provide? Hospice services can vary depending on the center or organization. Generally, they include:  Ways to keep you comfortable, such as: ? Providing care in your home or in a home-like setting. ? Working with your family and friends to help meet your needs. ? Allowing you to enjoy the support of loved ones by receiving much of your basic care from family and friends.  Pain relief and symptom management. The staff will supply all necessary medicines and equipment so that you can stay comfortable and alert enough to enjoy the company of your friends and family.  Visits or care from a nurse and doctor. This may include 24-hour on-call services.  Companionship when you are alone.  Allowing you and your family to rest. Hospice staff may do light housekeeping, prepare meals, and run errands.  Counseling. They will make sure your emotional, spiritual, and social needs are being met, as well as those needs of your family members.  Spiritual care. This will be individualized to meet your needs and your family's needs. It may involve: ? Helping you and your family understand the dying process. ? Helping you say goodbye to your family and friends. ? Performing a specific religious ceremony or ritual.  Massage.  Nutrition  therapy.  Physical and occupational therapy.  Short-term inpatient care, if something cannot be managed in the home.  Art or music therapy.  Bereavement support for grieving family members. When should hospice care begin? Most people who use hospice are believed to have less than 6 months to live.  Your family and health care providers can help you decide when hospice services should begin.  If you live longer than 6 months but your condition does not improve, your doctor may be able to approve you for continued hospice care.  If your condition improves, you may discontinue the program. What should I consider before selecting a program? Most hospice programs are run by nonprofit, independent organizations. Some are affiliated with hospitals, nursing homes, or home health care agencies. Hospice programs can take place in your home or at a hospice center, hospital, or skilled nursing facility. When choosing a hospice program, ask the following questions:  What services are available to me?  What services will be offered to my loved ones?  How involved will my loved ones be?  How involved will my health care provider be?  Who makes up the hospice care team? How are they trained or screened?  How will my pain and symptoms be managed?  If my circumstances change, can the services be provided in a different setting, such as my home or in the hospital?  Is the program reviewed and licensed by the state or certified in some other way?  What does it cost? Is it covered by insurance?  If I choose a hospice  center or nursing home, where is the hospice center located? Is it convenient for family and friends?  If I choose a hospice center or nursing home, can my family and friends visit any time?  Will you provide emotional and spiritual support?  Who can my family call with questions? Where can I learn more about hospice? You can learn about existing hospice programs in your area  from your health care providers. You can also read more about hospice online. The websites of the following organizations have helpful information:  [facility] and Palliative Care Organization [facility]): http://www.brown-buchanan.com/  National Association for [facility]): http://massey-hart.com/  Hospice Foundation of [place] ([place]): www.hospicefoundation.org  American Cancer Society (ACS): www.cancer.org  Hospice Net: www.hospicenet.org  Visiting Nurse Associations of [place] (VNAA): www.vnaa.org You may also find more information by contacting the following agencies:  A local agency on aging.  Your local [employer] chapter.  Your state's department of health or social services. Summary  Hospice is a service that is designed to provide people who are terminally ill and their families with medical, spiritual, and psychological support.  Hospice aims to improve your quality of life by keeping you as comfortable as possible in the final stages of life.  Hospice teams often include a doctor, nurse, social worker, counselor, religious leader,dietitian, therapists, and volunteers.  Hospice care generally includes medicine for symptom management, visits from doctors and nurses, physical and occupational therapy, nutrition counseling, spiritual and emotional counseling, caregiver support, and bereavement support for grieving family members.  Hospice programs can take place in your home or at a hospice center, hospital, or skilled nursing facility. This information is not intended to replace advice given to you by your health care provider. Make sure you discuss any questions you have with your health care provider. Document Released: 07/26/2003 Document Revised: 03/21/2017 Document Reviewed: 04/30/2016 Elsevier Patient Education  2020 [employer].

## 2019-01-12 NOTE — Progress Notes (Signed)
Report attempted x2 to [facility].

## 2019-01-12 NOTE — Progress Notes (Signed)
SLP Cancellation Note  Patient Details Name: Casey Andrade MRN: [id number] DOB: Aug 04, 1919   Cancelled treatment:        Attempted to see pt for ongoing dysphagia therapy. Pt was transferring to stretcher for discharge at time of SLP arrival.  Briefly spoke with daughter, who reports good tolerance of diet, but decreased PO intake.  Regurgitation and expectoration decreased.  Shared with daughter again the possibility of CP Bar in esophageal issues should symptoms return.  May want GI evaluation, if it is recurring issue.   Celedonio Savage, MA, [place] Office: [phone] 01/12/2019, 11:30 AM

## 2019-01-12 NOTE — Discharge Summary (Signed)
Physician Discharge Summary  Casey Andrade ZOX:[id number] DOB: 05-20-1919 DOA: 01/04/2019  PCP: Catha Gosselin, MD  Admit date: 01/04/2019 Discharge date: 01/12/2019  Admitted From: Skilled nursing facility Disposition: Assisted living facility with hospice  Recommendations for Outpatient Follow-up:  1. Follow up with PCP in 1-2 weeks 2. Please obtain BMP/CBC in one week 3. Please follow up on the following pending results:  Home Health: None Equipment/Devices: Rolling walker  Discharge Condition: Fair CODE STATUS: DNR Diet recommendation: Dysphagia 1 diet  Subjective: Seen and examined.  He is alert and oriented x3.  He has no complaint.  Brief/Interim Summary: Casey Andrade a 85 y.o.malewith medical history significant ofhypertension, hypertrophic cardiomyopathy last EF 65 to 70%, dementia, hypothyroidism, hyponatremia, and gait disturbance; who was brought into the ED after being found acutely altered.   Reportedly, he wasPreviously noted to be lethargic but arousable and speaking 15 minutes prior. Upon EMS arrival he was initially placed on a nonrebreather and patient appeared flaccid on the left side withsome muscle tone reported on the right. Initially suspected stroke versus aspiration.  Per daughter, patient has had chronic productive cough, but had increased sputum production although reported to be clear in the last week or so. Otherwise patient had been noted to be in fairly decent health for his age and utilized a rolling walker to ambulate as he was high falls risk.  ReportedlyPatient was given Haldol afternoon around 4:30p.m a day prior.according to daughter, he was previously given Haldol he slept for over 24 hours thereafter.  On admission into the emergency department patient was found to be febrile up to 100.8 F rectally, respirations 16-26, blood pressure noted as 97/45, and O2 saturation maintained currently on 1 L nasal cannula oxygen and his white cells  were 14.7.  CT scan of the brain was negative for any acute abnormalities, and code stroke had been canceled after patient was evaluated by neurology. Chest x-ray showed some concern for possible aspiration pneumonialabs significant for WBC 14.8.COVID-19negative. No initial lactic acid was obtained.  Patient was diagnosed with sepsis secondary to possible aspiration pneumoniapatient was started on empiric antibiotics of vancomycin, metronidazole, and cefepime which were then de-escalated to cefepime and then Rocephin.  Neurology had a long discussion with the family and they preferred not to do MRI for further possible stroke work-up.  For that reason, repeat CT head was done by neurology which ruled out any stroke.  Palliative care was then consulted per family's request and family has desired for the patient to go to assisted living facility with hospice however they wanted patient to stay in the hospital for at least few days, get physical therapy and get hospital bed at the facility before discharge and all of that has been arranged for him.  Patient has completed biotic course for his pneumonia.  He is doing much better, alert and oriented x3 and hemodynamically stable so he will be discharged today to ALF with hospice.  Discharge Diagnoses:  Principal Problem:   Sepsis (HCC) Active Problems:   HOCM (hypertrophic obstructive cardiomyopathy) (HCC)   Hypothyroidism   Transient hypotension   Encephalopathy   Altered mental status   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 [facility])   Acute delirium   Palliative care encounter    Discharge Instructions  Discharge Instructions    Discharge patient   Complete by: As directed    Discharge disposition: 50-Hospice/Home   Discharge patient date: 01/08/2019   Discharge patient   Complete by:  As directed    Discharge disposition: 50-Hospice/Home   Discharge patient date: 01/12/2019     Allergies as of  01/12/2019      Reactions   Ativan [lorazepam] Other (See Comments)   Over sedation and lethargy.   Augmentin [amoxicillin-pot Clavulanate]    Doesn't recall   Hydrocodone Bitartrate [hydrocodone]    Doesn't recall   Neosporin [bacitracin-polymyxin B] Other (See Comments)   unknown      Medication List    STOP taking these medications   busPIRone 5 MG tablet Commonly known as: BUSPAR     TAKE these medications   Biotin 2500 MCG Caps Take 2,500 mcg by mouth daily.   bisacodyl 10 MG suppository Commonly known as: DULCOLAX Place 10 mg rectally once.   finasteride 5 MG tablet Commonly known as: PROSCAR Take 5 mg by mouth at bedtime.   losartan 100 MG tablet Commonly known as: COZAAR TAKE 1 TABLET (100 MG TOTAL) BY MOUTH DAILY. What changed: how much to take   Melatonin 5 MG Tabs Take 5 mg by mouth at bedtime.   metoprolol tartrate 25 MG tablet Commonly known as: LOPRESSOR TAKE 1/2 TABLET BY MOUTH TWICE A DAY   OLANZapine zydis 5 MG disintegrating tablet Commonly known as: ZYPREXA Take 0.5 tablets (2.5 mg total) by mouth every 12 (twelve) hours as needed (Insomnia and agitation).   risperiDONE 0.5 MG tablet Commonly known as: RISPERDAL Take 0.5 mg by mouth at bedtime.   Synthroid 50 MCG tablet Generic drug: levothyroxine Take 50 mcg by mouth daily before breakfast.   vitamin B-12 250 MCG tablet Commonly known as: CYANOCOBALAMIN Take 250 mcg by mouth daily.   Vitamin D3 50 MCG (2000 UT) capsule Take 2,000 Units by mouth. occasionally      Follow-up Information    Little, Caryn Bee, MD Follow up in 1 week(s).   Specialty: Family Medicine Contact information: [address] [place] [address] [phone]          Allergies  Allergen Reactions  . Ativan [Lorazepam] Other (See Comments)    Over sedation and lethargy.  . Augmentin [Amoxicillin-Pot Clavulanate]     Doesn't recall  . Hydrocodone Bitartrate [Hydrocodone]     Doesn't recall  .  Neosporin [Bacitracin-Polymyxin B] Other (See Comments)    unknown    Consultations: Neurology and palliative care   Procedures/Studies: Ct Head Wo Contrast  Result Date: 01/06/2019 CLINICAL DATA:  Stroke, follow-up EXAM: CT HEAD WITHOUT CONTRAST TECHNIQUE: Contiguous axial images were obtained from the base of the skull through the vertex without intravenous contrast. COMPARISON:  Head CT 01/04/2019 FINDINGS: Brain: There is no acute intracranial hemorrhage. No demarcated cortical infarction. No evidence of intracranial mass. No midline shift or extra-axial fluid collection. Redemonstrated chronic right basal ganglia lacunar infarct. Ill-defined hypoattenuation of the cerebral white matter is nonspecific, but consistent with chronic small vessel ischemic disease. Moderate generalized parenchymal atrophy. Vascular: No hyperdense vessel Skull: No calvarial fracture. Sinuses/Orbits: Imaged globes and orbits demonstrate no acute abnormality. Trace ethmoid sinus mucosal thickening. No significant mastoid effusion IMPRESSION: No CT evidence of acute infarct. Consider brain MRI for further evaluation, as clinically warranted. Chronic small vessel ischemic disease with redemonstrated chronic right basal ganglia lacunar infarct. Moderate generalized atrophy. Electronically Signed   By: Jackey Loge   On: 01/06/2019 15:15   Dg Chest Portable 1 View  Result Date: 01/04/2019 CLINICAL DATA:  Altered mental status. EXAM: PORTABLE CHEST 1 VIEW COMPARISON:  None. FINDINGS: Cardiac silhouette is normal  in size. No mediastinal or hilar masses or evidence of adenopathy. Linear opacities noted the right lung base consistent with atelectasis or scarring. Lungs otherwise clear. No pleural effusion or pneumothorax. Skeletal structures are demineralized but grossly intact. IMPRESSION: No active disease. Electronically Signed   By: Amie Portland M.D.   On: 01/04/2019 13:37   Dg Swallowing Func-speech Pathology  Result  Date: 01/07/2019 Objective Swallowing Evaluation: Type of Study: Bedside Swallow Evaluation  Patient Details Name: Casey Andrade MRN: [id number] Date of Birth: 1919/06/27 Today's Date: 01/07/2019 Time: SLP Start Time (ACUTE ONLY): 5364 -SLP Stop Time (ACUTE ONLY): 0904 SLP Time Calculation (min) (ACUTE ONLY): 12 min Past Medical History: Past Medical History: Diagnosis Date . Dyspnea   increasing . Essential hypertension  . History of echocardiogram 04/27/2010  EF was in the range of 65-70% / Mitral valve, systolic anterior motion of the anterior mitral chordae              . Hypertension  . Hypertr obst cardiomyop  . Hyponatremia   for which nephrology is seeing him . Hypothyroidism  . 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 Murmur   noted to have loud murmur . Right bundle branch block  . Weakness   chronic weakness without syncope Past Surgical History: Past Surgical History: Procedure Laterality Date . TRANSTHORACIC ECHOCARDIOGRAM  04/2010  Vigorous LV function with systolic anterior motion of the anterior mitral valve mild dynamic LVOT obstruction with peak gradient 70 mmHg. EF 65-70%. GR 1 DD. Moderate-severe MR HPI: Casey Andrade is a 85 y.o. male with medical history significant of hypertension, hypertrophic cardiomyopathy last EF 65 to 70%, dementia, hypothyroidism, hyponatremia, and gait disturbance; who presents after being found acutely altered.  Pt's daughter reported that pt has a baseline productive cough, but sputum production has increased in the last week.  Pt initially appeared to be flaccid on L side; however, head CT was clear.  CXR on  9/14 was unremarkable.   Subjective: Pt was encountered lying reclined in bed, moaning with bilateral mitts in place.  RN reported that pt was oriented x0 and that he had been combative this morning. Assessment / Plan / Recommendation CHL IP CLINICAL IMPRESSIONS 01/07/2019 Clinical  Impression Patient presents with a moderate pharyngeal dysphagia. Oral phase was grossly normal with complete mastication of solids, good bolus control and complete oral clearance. A delayed swallow initiation to the valleculae with nectar thick, pudding and cracker and to pyriforms with thin liquids. Incomplete laryngeal anterior movement and closure across all consistencies. Laryngeal penetration/aspiration with thin liquids of any quantity. Patient did not initiate a throat clear or cough and was uanble to clear with a cued cough. Recommend a Dys 3, nectar thickened liquid diet. Medications given whole in puree. ST to follow for diet tolerance, upgrades and aspiration precaution training.  SLP Visit Diagnosis Dysphagia, unspecified (R13.10) Attention and concentration deficit following -- Frontal lobe and executive function deficit following -- Impact on safety and function Moderate aspiration risk   CHL IP TREATMENT RECOMMENDATION 01/07/2019 Treatment Recommendations Therapy as outlined in treatment plan below   Prognosis 01/07/2019 Prognosis for Safe Diet Advancement Good Barriers to Reach Goals -- Barriers/Prognosis Comment -- CHL IP DIET RECOMMENDATION 01/07/2019 SLP Diet Recommendations Dysphagia 3 (Mech soft) solids;Nectar thick liquid Liquid Administration via Cup Medication Administration Whole meds with puree Compensations Slow rate;Small sips/bites;Minimize environmental distractions Postural Changes Seated upright at 90 degrees  CHL IP OTHER RECOMMENDATIONS 01/07/2019 Recommended Consults -- Oral Care Recommendations Oral care BID Other Recommendations Order thickener from pharmacy   CHL IP FOLLOW UP RECOMMENDATIONS 01/07/2019 Follow up Recommendations Skilled Nursing facility   [facility] IP FREQUENCY AND DURATION 01/06/2019 Speech Therapy Frequency (ACUTE ONLY) min 2x/week Treatment Duration 2 weeks      CHL IP ORAL PHASE 01/07/2019 Oral Phase WFL Oral - Pudding Teaspoon -- Oral - Pudding Cup -- Oral - Honey  Teaspoon -- Oral - Honey Cup -- Oral - Nectar Teaspoon -- Oral - Nectar Cup -- Oral - Nectar Straw -- Oral - Thin Teaspoon -- Oral - Thin Cup -- Oral - Thin Straw -- Oral - Puree -- Oral - Mech Soft -- Oral - Regular -- Oral - Multi-Consistency -- Oral - Pill -- Oral Phase - Comment --  CHL IP PHARYNGEAL PHASE 01/07/2019 Pharyngeal Phase Impaired Pharyngeal- Pudding Teaspoon Delayed swallow initiation-vallecula;Reduced laryngeal elevation;Reduced airway/laryngeal closure;Pharyngeal residue - valleculae Pharyngeal -- Pharyngeal- Pudding Cup -- Pharyngeal -- Pharyngeal- Honey Teaspoon -- Pharyngeal -- Pharyngeal- Honey Cup -- Pharyngeal -- Pharyngeal- Nectar Teaspoon -- Pharyngeal -- Pharyngeal- Nectar Cup Delayed swallow initiation-vallecula;Reduced laryngeal elevation;Reduced airway/laryngeal closure Pharyngeal Material does not enter airway Pharyngeal- Nectar Straw -- Pharyngeal -- Pharyngeal- Thin Teaspoon Delayed swallow initiation-pyriform sinuses;Reduced airway/laryngeal closure;Reduced laryngeal elevation;Trace aspiration;Pharyngeal residue - valleculae Pharyngeal Material enters airway, remains ABOVE vocal cords and not ejected out Pharyngeal- Thin Cup Delayed swallow initiation-pyriform sinuses;Reduced laryngeal elevation;Reduced airway/laryngeal closure;Moderate aspiration;Pharyngeal residue - valleculae Pharyngeal Material enters airway, remains ABOVE vocal cords and not ejected out Pharyngeal- Thin Straw -- Pharyngeal -- Pharyngeal- Puree -- Pharyngeal -- Pharyngeal- Mechanical Soft Delayed swallow initiation-vallecula;Reduced laryngeal elevation;Reduced airway/laryngeal closure Pharyngeal -- Pharyngeal- Regular -- Pharyngeal -- Pharyngeal- Multi-consistency -- Pharyngeal -- Pharyngeal- Pill -- Pharyngeal -- Pharyngeal Comment --  No flowsheet data found. Lindalou Hose Ward, MA, CCC-SLP 01/07/2019 1:55 PM              Ct Head Code Stroke Wo Contrast  Result Date: 01/04/2019 CLINICAL DATA:  Code stroke.  Altered level of consciousness, unexplained. Last known normal 11 a.m., left-sided weakness. EXAM: CT HEAD WITHOUT CONTRAST TECHNIQUE: Contiguous axial images were obtained from the base of the skull through the vertex without intravenous contrast. COMPARISON:  Head CT 09/15/2018 FINDINGS: Brain: There is no acute intracranial hemorrhage or demarcated cortical infarct. Chronic right basal ganglia lacunar infarct with involvement of the right caudate nucleus. No evidence of intracranial mass. No midline shift or extra-axial fluid collection. Moderate generalized parenchymal atrophy. Ill-defined hypoattenuation of the cerebral white matter is nonspecific, but consistent with chronic small vessel ischemic disease. Vascular: No definite hyperdense vessel. Atherosclerotic calcification of the carotid artery siphons. Skull: No calvarial fracture. Sinuses/Orbits: Imaged orbits demonstrate no acute abnormality. Mild scattered paranasal sinus mucosal thickening. No significant mastoid effusion. ASPECTS ([place] Stroke Program Early CT Score) - Ganglionic level infarction (caudate, lentiform nuclei, internal capsule, insula, M1-M3 cortex): 7 - Supraganglionic infarction (M4-M6 cortex): 3 Total score (0-10 with 10 being normal): 10 (excluding chronic right basal ganglia lacunar infarct.) These results were communicated to Dr. Laurence Slate at 12:22 pmon 9/14/2020by text page via the [facility] messaging system. IMPRESSION: No acute intracranial hemorrhage or demarcated cortical infarct. Generalized parenchymal atrophy with chronic small vessel ischemic disease. Chronic lacunar infarct within the right basal ganglia. Electronically Signed   By: Jackey Loge   On: 01/04/2019 12:23      Discharge Exam: Vitals:   01/11/19 2131 01/12/19 0521  BP: 136/76 (!) 155/74  Pulse:  87 93  Resp:    Temp: 98.5 F (36.9 C) 98.1 F (36.7 C)  SpO2: 97% 96%   Vitals:   01/11/19 0607 01/11/19 1512 01/11/19 2131 01/12/19 0521  BP: (!) 151/98  (!) 147/71 136/76 (!) 155/74  Pulse: 75 74 87 93  Resp: 18 16    Temp: 97.8 F (36.6 C) 97.6 F (36.4 C) 98.5 F (36.9 C) 98.1 F (36.7 C)  TempSrc:  Axillary Oral   SpO2: 97% 96% 97% 96%  Weight: 60.9 kg     Height:        General: Pt is alert, awake, not in acute distress Cardiovascular: RRR, S1/S2 +, no rubs, no gallops Respiratory: CTA bilaterally, no wheezing, no rhonchi Abdominal: Soft, NT, ND, bowel sounds + Extremities: no edema, no cyanosis    The results of significant diagnostics from this hospitalization (including imaging, microbiology, ancillary and laboratory) are listed below for reference.     Microbiology: Recent Results (from the past 240 hour(s))  SARS Coronavirus 2 [facility](Hospital order, [facility] Nasopharyngeal Nasopharyngeal Swab     Status: None   Collection Time: 01/04/19  1:50 PM   Specimen: Nasopharyngeal Swab  Result Value Ref Range Status   SARS Coronavirus 2 NEGATIVE NEGATIVE Final    Comment: (NOTE) If result is NEGATIVE SARS-CoV-2 target nucleic acids are NOT DETECTED. The SARS-CoV-2 RNA is generally detectable in upper and lower  respiratory specimens during the acute phase of infection. The lowest  concentration of SARS-CoV-2 viral copies this assay can detect is 250  copies / mL. A negative result does not preclude SARS-CoV-2 infection  and should not be used as the sole basis for treatment or other  patient management decisions.  A negative result may occur with  improper specimen collection / handling, submission of specimen other  than nasopharyngeal swab, presence of viral mutation(s) within the  areas targeted by this assay, and inadequate number of viral copies  (<250 copies / mL). A negative result must be combined with clinical  observations, patient history, and epidemiological information. If result is POSITIVE SARS-CoV-2 target nucleic acids are DETECTED. The SARS-CoV-2 RNA is generally detectable in  upper and lower  respiratory specimens dur ing the acute phase of infection.  Positive  results are indicative of active infection with SARS-CoV-2.  Clinical  correlation with patient history and other diagnostic information is  necessary to determine patient infection status.  Positive results do  not rule out bacterial infection or co-infection with other viruses. If result is PRESUMPTIVE POSTIVE SARS-CoV-2 nucleic acids MAY BE PRESENT.   A presumptive positive result was obtained on the submitted specimen  and confirmed on repeat testing.  While 2019 novel coronavirus  (SARS-CoV-2) nucleic acids may be present in the submitted sample  additional confirmatory testing may be necessary for epidemiological  and / or clinical management purposes  to differentiate between  SARS-CoV-2 and other Sarbecovirus currently known to infect humans.  If clinically indicated additional testing with an alternate test  methodology [phone]([id number]) is advised. The SARS-CoV-2 RNA is generally  detectable in upper and lower respiratory sp ecimens during the acute  phase of infection. The expected result is Negative. Fact Sheet for Patients:  BoilerBrush.com.cyhttps://www.fda.gov/media/136312/download Fact Sheet for Healthcare Providers: https://pope.com/https://www.fda.gov/media/136313/download This test is not yet approved or cleared by the [place] FDA and has been authorized for detection and/or diagnosis of SARS-CoV-2 by FDA under an Emergency Use Authorization (EUA).  This EUA will remain in effect (meaning  this test can be used) for the duration of the COVID-19 declaration under Section 564(b)(1) of the Act, 21 U.S.C. section 360bbb-3(b)(1), unless the authorization is terminated or revoked sooner. Performed at [facility], [address] [place], [place] [address]   Culture, blood (x 2)     Status: Abnormal   Collection Time: 01/04/19  5:55 PM   Specimen: BLOOD  Result Value Ref Range Status   Specimen Description  BLOOD RIGHT ANTECUBITAL  Final   Special Requests   Final    BOTTLES DRAWN AEROBIC AND ANAEROBIC Blood Culture results may not be optimal due to an inadequate volume of blood received in culture bottles   Culture  Setup Time   Final    GRAM POSITIVE COCCI ANAEROBIC BOTTLE ONLY CRITICAL RESULT CALLED TO, READ BACK BY AND VERIFIED WITH: T. BAUMEISTER, PHARMD AT 1405 ON 01/06/19 BY C. JESSUP, MT.    Culture (A)  Final    STAPHYLOCOCCUS SPECIES (COAGULASE NEGATIVE) THE SIGNIFICANCE OF ISOLATING THIS ORGANISM FROM A SINGLE SET OF BLOOD CULTURES WHEN MULTIPLE SETS ARE DRAWN IS UNCERTAIN. PLEASE NOTIFY THE MICROBIOLOGY DEPARTMENT WITHIN ONE WEEK IF SPECIATION AND SENSITIVITIES ARE REQUIRED. Performed at [facility], [address] [place], [place] [address]    Report Status 01/08/2019 FINAL  Final  Blood Culture ID Panel (Reflexed)     Status: Abnormal   Collection Time: 01/04/19  5:55 PM  Result Value Ref Range Status   Enterococcus species NOT DETECTED NOT DETECTED Final   Listeria monocytogenes NOT DETECTED NOT DETECTED Final   Staphylococcus species DETECTED (A) NOT DETECTED Final    Comment: Methicillin (oxacillin) susceptible coagulase negative staphylococcus. Possible blood culture contaminant (unless isolated from more than one blood culture draw or clinical case suggests pathogenicity). No antibiotic treatment is indicated for blood  culture contaminants. CRITICAL RESULT CALLED TO, READ BACK BY AND VERIFIED WITH: T. BAUMEISTER, PHARMD AT 1405 ON 01/06/19 BY C. JESSUP, MT.    Staphylococcus aureus (BCID) NOT DETECTED NOT DETECTED Final   Methicillin resistance NOT DETECTED NOT DETECTED Final   Streptococcus species NOT DETECTED NOT DETECTED Final   Streptococcus agalactiae NOT DETECTED NOT DETECTED Final   Streptococcus pneumoniae NOT DETECTED NOT DETECTED Final   Streptococcus pyogenes NOT DETECTED NOT DETECTED Final   Acinetobacter baumannii NOT DETECTED NOT DETECTED Final    Enterobacteriaceae species NOT DETECTED NOT DETECTED Final   Enterobacter cloacae complex NOT DETECTED NOT DETECTED Final   Escherichia coli NOT DETECTED NOT DETECTED Final   Klebsiella oxytoca NOT DETECTED NOT DETECTED Final   Klebsiella pneumoniae NOT DETECTED NOT DETECTED Final   Proteus species NOT DETECTED NOT DETECTED Final   Serratia marcescens NOT DETECTED NOT DETECTED Final   Haemophilus influenzae NOT DETECTED NOT DETECTED Final   Neisseria meningitidis NOT DETECTED NOT DETECTED Final   Pseudomonas aeruginosa NOT DETECTED NOT DETECTED Final   Candida albicans NOT DETECTED NOT DETECTED Final   Candida glabrata NOT DETECTED NOT DETECTED Final   Candida krusei NOT DETECTED NOT DETECTED Final   Candida parapsilosis NOT DETECTED NOT DETECTED Final   Candida tropicalis NOT DETECTED NOT DETECTED Final    Comment: Performed at [facility], [address] [place], [place] [address]  Culture, blood (x 2)     Status: None   Collection Time: 01/04/19 10:27 PM   Specimen: BLOOD  Result Value Ref Range Status   Specimen Description BLOOD RIGHT ANTECUBITAL  Final   Special Requests   Final  BOTTLES DRAWN AEROBIC AND ANAEROBIC Blood Culture adequate volume   Culture   Final    NO GROWTH 5 DAYS Performed at [facility], [address], [place], [place] [address]    Report Status 01/09/2019 FINAL  Final  MRSA PCR Screening     Status: None   Collection Time: 01/05/19  4:28 PM   Specimen: Nasal Mucosa; Nasopharyngeal  Result Value Ref Range Status   MRSA by PCR NEGATIVE NEGATIVE Final    Comment:        The GeneXpert MRSA Assay (FDA approved for NASAL specimens only), is one component of a comprehensive MRSA colonization surveillance program. It is not intended to diagnose MRSA infection nor to guide or monitor treatment for MRSA infections. Performed at [facility], [address] [place], [place] [address]      Labs: BNP (last 3 results) No results  for input(s): BNP in the last 8760 hours. Basic Metabolic Panel: Recent Labs  Lab 01/06/19 0214 01/07/19 0150 01/08/19 0143  NA 139 141 140  K 3.7 3.6 3.9  CL 107 110 110  CO2 20* 20* 21*  GLUCOSE 71 116* 98  BUN 21 17 11   CREATININE 0.83 0.76 0.79  CALCIUM 8.6* 8.7* 8.4*  MG 1.9  --   --    Liver Function Tests: Recent Labs  Lab 01/06/19 0214 01/07/19 0150 01/08/19 0143  AST 25 39 37  ALT 20 21 21   ALKPHOS 67 65 63  BILITOT 1.3* 0.5 0.5  PROT 5.7* 6.0* 5.7*  ALBUMIN 3.2* 3.3* 3.2*   No results for input(s): LIPASE, AMYLASE in the last 168 hours. No results for input(s): AMMONIA in the last 168 hours. CBC: Recent Labs  Lab 01/08/19 0143 01/09/19 0226 01/10/19 0337 01/11/19 0235 01/12/19 0242  WBC 5.6 5.1 4.5 4.7 9.5  NEUTROABS 3.5 2.9 2.4 2.5 7.6  HGB 13.9 13.9 12.8* 12.7* 12.9*  HCT 42.7 41.7 37.3* 39.2 39.1  MCV 101.2* 97.9 96.1 99.5 97.8  PLT 187 187 168 193 188   Cardiac Enzymes: No results for input(s): CKTOTAL, CKMB, CKMBINDEX, TROPONINI in the last 168 hours. BNP: Invalid input(s): POCBNP CBG: Recent Labs  Lab 01/06/19 0442 01/06/19 0650 01/08/19 1937 01/09/19 0738 01/09/19 1237  GLUCAP 66* 98 113* 87 96   D-Dimer No results for input(s): DDIMER in the last 72 hours. Hgb A1c No results for input(s): HGBA1C in the last 72 hours. Lipid Profile No results for input(s): CHOL, HDL, LDLCALC, TRIG, CHOLHDL, LDLDIRECT in the last 72 hours. Thyroid function studies No results for input(s): TSH, T4TOTAL, T3FREE, THYROIDAB in the last 72 hours.  Invalid input(s): FREET3 Anemia work up No results for input(s): VITAMINB12, FOLATE, FERRITIN, TIBC, IRON, RETICCTPCT in the last 72 hours. Urinalysis    Component Value Date/Time   COLORURINE AMBER (A) 01/04/2019 1730   APPEARANCEUR HAZY (A) 01/04/2019 1730   LABSPEC 1.019 01/04/2019 1730   PHURINE 7.0 01/04/2019 1730   GLUCOSEU NEGATIVE 01/04/2019 1730   HGBUR NEGATIVE 01/04/2019 1730    BILIRUBINUR NEGATIVE 01/04/2019 1730   KETONESUR 20 (A) 01/04/2019 1730   PROTEINUR 100 (A) 01/04/2019 1730   NITRITE NEGATIVE 01/04/2019 1730   LEUKOCYTESUR NEGATIVE 01/04/2019 1730   Sepsis Labs Invalid input(s): PROCALCITONIN,  WBC,  LACTICIDVEN Microbiology Recent Results (from the past 240 hour(s))  SARS Coronavirus 2 [facility] order, Performed in [facility] hospital lab) Nasopharyngeal Nasopharyngeal Swab     Status: None   Collection Time: 01/04/19  1:50 PM  Specimen: Nasopharyngeal Swab  Result Value Ref Range Status   SARS Coronavirus 2 NEGATIVE NEGATIVE Final    Comment: (NOTE) If result is NEGATIVE SARS-CoV-2 target nucleic acids are NOT DETECTED. The SARS-CoV-2 RNA is generally detectable in upper and lower  respiratory specimens during the acute phase of infection. The lowest  concentration of SARS-CoV-2 viral copies this assay can detect is 250  copies / mL. A negative result does not preclude SARS-CoV-2 infection  and should not be used as the sole basis for treatment or other  patient management decisions.  A negative result may occur with  improper specimen collection / handling, submission of specimen other  than nasopharyngeal swab, presence of viral mutation(s) within the  areas targeted by this assay, and inadequate number of viral copies  (<250 copies / mL). A negative result must be combined with clinical  observations, patient history, and epidemiological information. If result is POSITIVE SARS-CoV-2 target nucleic acids are DETECTED. The SARS-CoV-2 RNA is generally detectable in upper and lower  respiratory specimens dur ing the acute phase of infection.  Positive  results are indicative of active infection with SARS-CoV-2.  Clinical  correlation with patient history and other diagnostic information is  necessary to determine patient infection status.  Positive results do  not rule out bacterial infection or co-infection with other viruses. If result  is PRESUMPTIVE POSTIVE SARS-CoV-2 nucleic acids MAY BE PRESENT.   A presumptive positive result was obtained on the submitted specimen  and confirmed on repeat testing.  While 2019 novel coronavirus  (SARS-CoV-2) nucleic acids may be present in the submitted sample  additional confirmatory testing may be necessary for epidemiological  and / or clinical management purposes  to differentiate between  SARS-CoV-2 and other Sarbecovirus currently known to infect humans.  If clinically indicated additional testing with an alternate test  methodology [phone]) is advised. The SARS-CoV-2 RNA is generally  detectable in upper and lower respiratory sp ecimens during the acute  phase of infection. The expected result is Negative. Fact Sheet for Patients:  BoilerBrush.com.cy Fact Sheet for Healthcare Providers: https://pope.com/ This test is not yet approved or cleared by the [place] FDA and has been authorized for detection and/or diagnosis of SARS-CoV-2 by FDA under an Emergency Use Authorization (EUA).  This EUA will remain in effect (meaning this test can be used) for the duration of the COVID-19 declaration under Section 564(b)(1) of the Act, 21 U.S.C. section 360bbb-3(b)(1), unless the authorization is terminated or revoked sooner. Performed at [facility], [address], [place], [place] [address]   Culture, blood (x 2)     Status: Abnormal   Collection Time: 01/04/19  5:55 PM   Specimen: BLOOD  Result Value Ref Range Status   Specimen Description BLOOD RIGHT ANTECUBITAL  Final   Special Requests   Final    BOTTLES DRAWN AEROBIC AND ANAEROBIC Blood Culture results may not be optimal due to an inadequate volume of blood received in culture bottles   Culture  Setup Time   Final    GRAM POSITIVE COCCI ANAEROBIC BOTTLE ONLY CRITICAL RESULT CALLED TO, READ BACK BY AND VERIFIED WITH: T. BAUMEISTER, PHARMD AT 1405 ON 01/06/19 BY C.  JESSUP, MT.    Culture (A)  Final    STAPHYLOCOCCUS SPECIES (COAGULASE NEGATIVE) THE SIGNIFICANCE OF ISOLATING THIS ORGANISM FROM A SINGLE SET OF BLOOD CULTURES WHEN MULTIPLE SETS ARE DRAWN IS UNCERTAIN. PLEASE NOTIFY THE MICROBIOLOGY DEPARTMENT WITHIN ONE WEEK IF SPECIATION AND SENSITIVITIES ARE  REQUIRED. Performed at [facility], [place] [address] [place], [place] [address]    Report Status 01/08/2019 FINAL  Final  Blood Culture ID Panel (Reflexed)     Status: Abnormal   Collection Time: 01/04/19  5:55 PM  Result Value Ref Range Status   Enterococcus species NOT DETECTED NOT DETECTED Final   Listeria monocytogenes NOT DETECTED NOT DETECTED Final   Staphylococcus species DETECTED (A) NOT DETECTED Final    Comment: Methicillin (oxacillin) susceptible coagulase negative staphylococcus. Possible blood culture contaminant (unless isolated from more than one blood culture draw or clinical case suggests pathogenicity). No antibiotic treatment is indicated for blood  culture contaminants. CRITICAL RESULT CALLED TO, READ BACK BY AND VERIFIED WITH: T. BAUMEISTER, PHARMD AT 1405 ON 01/06/19 BY C. JESSUP, MT.    Staphylococcus aureus (BCID) NOT DETECTED NOT DETECTED Final   Methicillin resistance NOT DETECTED NOT DETECTED Final   Streptococcus species NOT DETECTED NOT DETECTED Final   Streptococcus agalactiae NOT DETECTED NOT DETECTED Final   Streptococcus pneumoniae NOT DETECTED NOT DETECTED Final   Streptococcus pyogenes NOT DETECTED NOT DETECTED Final   Acinetobacter baumannii NOT DETECTED NOT DETECTED Final   Enterobacteriaceae species NOT DETECTED NOT DETECTED Final   Enterobacter cloacae complex NOT DETECTED NOT DETECTED Final   Escherichia coli NOT DETECTED NOT DETECTED Final   Klebsiella oxytoca NOT DETECTED NOT DETECTED Final   Klebsiella pneumoniae NOT DETECTED NOT DETECTED Final   Proteus species NOT DETECTED NOT DETECTED Final   Serratia marcescens NOT DETECTED NOT DETECTED  Final   Haemophilus influenzae NOT DETECTED NOT DETECTED Final   Neisseria meningitidis NOT DETECTED NOT DETECTED Final   Pseudomonas aeruginosa NOT DETECTED NOT DETECTED Final   Candida albicans NOT DETECTED NOT DETECTED Final   Candida glabrata NOT DETECTED NOT DETECTED Final   Candida krusei NOT DETECTED NOT DETECTED Final   Candida parapsilosis NOT DETECTED NOT DETECTED Final   Candida tropicalis NOT DETECTED NOT DETECTED Final    Comment: Performed at [facility], [place] [address], [place], [place] [address]  Culture, blood (x 2)     Status: None   Collection Time: 01/04/19 10:27 PM   Specimen: BLOOD  Result Value Ref Range Status   Specimen Description BLOOD RIGHT ANTECUBITAL  Final   Special Requests   Final    BOTTLES DRAWN AEROBIC AND ANAEROBIC Blood Culture adequate volume   Culture   Final    NO GROWTH 5 DAYS Performed at [facility], [place] [address], [place], [place] [address]    Report Status 01/09/2019 FINAL  Final  MRSA PCR Screening     Status: None   Collection Time: 01/05/19  4:28 PM   Specimen: Nasal Mucosa; Nasopharyngeal  Result Value Ref Range Status   MRSA by PCR NEGATIVE NEGATIVE Final    Comment:        The GeneXpert MRSA Assay (FDA approved for NASAL specimens only), is one component of a comprehensive MRSA colonization surveillance program. It is not intended to diagnose MRSA infection nor to guide or monitor treatment for MRSA infections. Performed at [facility], [place] [address], [place], [place] [address]      Time coordinating discharge: Over 30 minutes  SIGNED:   Darliss Cheney, MD  [facility] 01/12/2019, 9:09 AM Pager [phone]  If 7PM-7AM, please contact night-coverage [email or web address] Password [email or web address]

## 2019-01-18 ENCOUNTER — Emergency Department
Admission: EM | Admit: 2019-01-18 | Discharge: 2019-01-18 | Disposition: A | Discharge status: Home / Self Care | Attending: Student in an Organized Health Care Education/Training Program | Admitting: Student in an Organized Health Care Education/Training Program

## 2019-01-18 ENCOUNTER — Other Ambulatory Visit: Payer: Self-pay

## 2019-01-18 ENCOUNTER — Emergency Department

## 2019-01-18 DIAGNOSIS — I10 Essential (primary) hypertension: Secondary | ICD-10-CM | POA: Diagnosis not present

## 2019-01-18 DIAGNOSIS — Z23 Encounter for immunization: Secondary | ICD-10-CM | POA: Insufficient documentation

## 2019-01-18 DIAGNOSIS — Y92122 Bedroom in nursing home as the place of occurrence of the external cause: Secondary | ICD-10-CM | POA: Insufficient documentation

## 2019-01-18 DIAGNOSIS — S80212A Abrasion, left knee, initial encounter: Secondary | ICD-10-CM | POA: Diagnosis not present

## 2019-01-18 DIAGNOSIS — W06XXXA Fall from bed, initial encounter: Secondary | ICD-10-CM | POA: Diagnosis not present

## 2019-01-18 DIAGNOSIS — F0390 Unspecified dementia without behavioral disturbance: Secondary | ICD-10-CM | POA: Insufficient documentation

## 2019-01-18 DIAGNOSIS — E039 Hypothyroidism, unspecified: Secondary | ICD-10-CM | POA: Diagnosis not present

## 2019-01-18 DIAGNOSIS — Y9389 Activity, other specified: Secondary | ICD-10-CM | POA: Insufficient documentation

## 2019-01-18 DIAGNOSIS — Z79899 Other long term (current) drug therapy: Secondary | ICD-10-CM | POA: Diagnosis not present

## 2019-01-18 DIAGNOSIS — Y999 Unspecified external cause status: Secondary | ICD-10-CM | POA: Insufficient documentation

## 2019-01-18 MED ORDER — TETANUS-DIPHTH-ACELL PERTUSSIS 5-2.5-18.5 LF-MCG/0.5 IM SUSP
0.5000 mL | Freq: Once | INTRAMUSCULAR | Status: AC
  Administered 2019-01-18: 08:00:00 0.5 mL via INTRAMUSCULAR
  Filled 2019-01-18: qty 0.5

## 2019-01-18 NOTE — ED Notes (Signed)
Left knee cleansed.

## 2019-01-18 NOTE — ED Notes (Signed)
Report to [facility] given

## 2019-01-18 NOTE — ED Notes (Signed)
Report to collyn, rn 

## 2019-01-18 NOTE — ED Notes (Signed)
Pt resting in bed with eyes closed, rise and fall of chest noted. NAD. Bed locked and low.

## 2019-01-18 NOTE — ED Notes (Signed)
Patients daughter will be picking up patient and transporting him back to [facility]. [place] made aware.

## 2019-01-18 NOTE — ED Notes (Signed)
md in to see pt.  

## 2019-01-18 NOTE — ED Notes (Signed)
[place] and spoke with Mardene Celeste in regards to transport needed for patients discharge. She reports she needs to speak to their director and she will get back in touch.

## 2019-01-18 NOTE — ED Triage Notes (Signed)
Pt from [facility] with unwitnessed fall. Pt denies pain, pt with history of dementia. Pt with abrasion noted to left lateral knee with controlled bleeding. Pt states "I shouldn't have been getting out of bed without them."

## 2019-01-18 NOTE — ED Provider Notes (Signed)
[facility] Emergency Department Provider Note    First MD Initiated Contact with Patient 01/18/19 [phone]     (approximate)  I have reviewed the triage vital signs and the nursing notes.   HISTORY  Chief Complaint Fall    HPI Casey Andrade is a 85 y.o. male the below listed past medical history presents from nursing home after mechanical fall after he was getting out of bed.  Did fall and abraded his left lateral knee.  Does not have any plantar pain.  Denies any head injury.  No neck pain.  No chest pain or abdominal pain.  States that he is ready to be discharged and did not feel like he needed to be brought to the ER.    Past Medical History:  Diagnosis Date  . Dyspnea    increasing  . Essential hypertension   . History of echocardiogram 04/27/2010   EF was in the range of 65-70% / Mitral valve, systolic anterior motion of the anterior mitral chordae               . Hypertension   . Hypertr obst cardiomyop   . Hyponatremia    for which nephrology is seeing him  . Hypothyroidism   . 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 Murmur    noted to have loud murmur  . Right bundle branch block   . Weakness    chronic weakness without syncope   Family History  Problem Relation Age of Onset  . Diabetes Sister    Past Surgical History:  Procedure Laterality Date  . TRANSTHORACIC ECHOCARDIOGRAM  04/2010   Vigorous LV function with systolic anterior motion of the anterior mitral valve mild dynamic LVOT obstruction with peak gradient 70 mmHg. EF 65-70%. GR 1 DD. Moderate-severe MR   Patient Active Problem List   Diagnosis Date Noted  . Altered mental status   . 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 (HCC)   . Acute delirium   . Palliative care encounter   . Sepsis (HCC) 01/04/2019  . Transient hypotension 01/04/2019  .  Encephalopathy 01/04/2019  . Mitral valvular regurgitation 08/17/2016  . HOCM (hypertrophic obstructive cardiomyopathy) (HCC) 09/14/2010  . RBBB (right bundle branch block) 09/14/2010  . Hypothyroidism 09/14/2010  . Benign hypertensive heart disease without heart failure 09/14/2010      Prior to Admission medications   Medication Sig Start Date End Date Taking? Authorizing Provider  Biotin 2500 MCG CAPS Take 2,500 mcg by mouth daily.    [provider]  bisacodyl (DULCOLAX) 10 MG suppository Place 10 mg rectally once.    [provider]  Cholecalciferol (VITAMIN D3) 2000 UNITS capsule Take 2,000 Units by mouth. occasionally     [provider]  finasteride (PROSCAR) 5 MG tablet Take 5 mg by mouth at bedtime.    [provider]  losartan (COZAAR) 100 MG tablet TAKE 1 TABLET (100 MG TOTAL) BY MOUTH DAILY. Patient taking differently: Take 50 mg by mouth daily.  05/06/16   Marykay LexHarding, David W, MD  Melatonin 5 MG TABS Take 5 mg by mouth at bedtime.    [provider]  metoprolol tartrate (LOPRESSOR) 25 MG tablet TAKE 1/2 TABLET BY MOUTH TWICE A DAY Patient taking differently: Take 12.5 mg by mouth 2 (two) times daily.  08/25/17   Marykay LexHarding, David W, MD  OLANZapine zydis [facility](ZYPREXA) 5  MG disintegrating tablet Take 0.5 tablets (2.5 mg total) by mouth every 12 (twelve) hours as needed (Insomnia and agitation). 01/12/19 02/11/19  Hughie Closs, MD  risperiDONE (RISPERDAL) 0.5 MG tablet Take 0.5 mg by mouth at bedtime.    [provider]  SYNTHROID 50 MCG tablet Take 50 mcg by mouth daily before breakfast. 06/08/15   [provider]  vitamin B-12 (CYANOCOBALAMIN) 250 MCG tablet Take 250 mcg by mouth daily.      [provider]    Allergies Ativan [lorazepam], Augmentin [amoxicillin-pot clavulanate], Hydrocodone bitartrate [hydrocodone], and Neosporin [bacitracin-polymyxin b]    Social History Social History   Tobacco Use  . Smoking  status: Never Smoker  . Smokeless tobacco: Never Used  Substance Use Topics  . Alcohol use: No  . Drug use: No    Review of Systems Patient denies headaches, rhinorrhea, blurry vision, numbness, shortness of breath, chest pain, edema, cough, abdominal pain, nausea, vomiting, diarrhea, dysuria, fevers, rashes or hallucinations unless otherwise stated above in HPI. ____________________________________________   PHYSICAL EXAM:  VITAL SIGNS: Vitals:   01/18/19 0627 01/18/19 0749  BP: (!) 207/82 (!) 183/69  Pulse: 76 98  Resp: 14 20  Temp: 98.3 F (36.8 C)   SpO2: 96% 98%    Constitutional: Alert and oriented.  Eyes: Conjunctivae are normal.  Head: Atraumatic. Nose: No congestion/rhinnorhea. Mouth/Throat: Mucous membranes are moist.   Neck: No stridor. Painless ROM.  Cardiovascular: Normal rate, regular rhythm. Grossly normal heart sounds.  Good peripheral circulation. Respiratory: Normal respiratory effort.  No retractions. Lungs CTAB. Gastrointestinal: Soft and nontender. No distention. No abdominal bruits. No CVA tenderness. Genitourinary:  Musculoskeletal: Small 1 cm superficial abrasion to left lateral knee with some mild surrounding tenderness.  No foreign bodies.  No effusion.  No deformity.  NV intact distally Neurologic:  Normal speech and language. No gross focal neurologic deficits are appreciated. No facial droop Skin:  Skin is warm, dry and intact. No rash noted. Psychiatric: Mood and affect are normal. Speech and behavior are normal.  ____________________________________________   LABS (all labs ordered are listed, but only abnormal results are displayed)  No results found for this or any previous visit (from the past 24 hour(s)). ____________________________________________  EKG My review and personal interpretation at Time: 6:27   Indication: fall  Rate: 75  Rhythm: sinus Axis: normal Other: rbbb, no stemi, abnml ekg  ____________________________________________  RADIOLOGY  I personally reviewed all radiographic images ordered to evaluate for the above acute complaints and reviewed radiology reports and findings.  These findings were personally discussed with the patient.  Please see medical record for radiology report.  ____________________________________________   PROCEDURES  Procedure(s) performed:  Procedures    Critical Care performed: no ____________________________________________   INITIAL IMPRESSION / ASSESSMENT AND PLAN / ED COURSE  Pertinent labs & imaging results that were available during my care of the patient were reviewed by me and considered in my medical decision making (see chart for details).   DDX: fracture, contusion, dislocation, abrasion, laceration  ZORAWAR STROLLO is a 85 y.o. who presents to the ED with abrasion and injury to left knee after mechanical fall.  Denies any other discomfort is requesting discharge.  No evidence of head neck or truncal trauma.  Denies any other complaints.  No evidence of fracture or dislocation.  Neurovascular intact distally.  Wound care provided.  Stable for outpatient follow-up.     The patient was evaluated in Emergency Department today for the symptoms described in  the history of present illness. He/she was evaluated in the context of the global COVID-19 pandemic, which necessitated consideration that the patient might be at risk for infection with the SARS-CoV-2 virus that causes COVID-19. Institutional protocols and algorithms that pertain to the evaluation of patients at risk for COVID-19 are in a state of rapid change based on information released by regulatory bodies including the CDC and federal and state organizations. These policies and algorithms were followed during the patient's care in the ED.  As part of my medical decision making, I reviewed the following data within the [place] notes reviewed  and incorporated, Labs reviewed, notes from prior ED visits and [place] Controlled Substance Database   ____________________________________________   FINAL CLINICAL IMPRESSION(S) / ED DIAGNOSES  Final diagnoses:  Abrasion of left knee, initial encounter      NEW MEDICATIONS STARTED DURING THIS VISIT:  New Prescriptions   No medications on file     Note:  This document was prepared using Dragon voice recognition software and may include unintentional dictation errors.    Merlyn Lot, MD 01/18/19 [phone]

## 2019-03-25 ENCOUNTER — Ambulatory Visit: Payer: Self-pay | Admitting: Podiatry

## 2019-04-25 ENCOUNTER — Emergency Department
Admission: EM | Admit: 2019-04-25 | Discharge: 2019-04-25 | Disposition: A | Discharge status: Skilled Nursing Facility | Payer: Medicare Other | Attending: Emergency Medicine | Admitting: Emergency Medicine

## 2019-04-25 ENCOUNTER — Other Ambulatory Visit: Payer: Self-pay

## 2019-04-25 DIAGNOSIS — R451 Restlessness and agitation: Secondary | ICD-10-CM | POA: Diagnosis present

## 2019-04-25 DIAGNOSIS — Y92129 Unspecified place in nursing home as the place of occurrence of the external cause: Secondary | ICD-10-CM | POA: Diagnosis not present

## 2019-04-25 DIAGNOSIS — W07XXXA Fall from chair, initial encounter: Secondary | ICD-10-CM | POA: Diagnosis not present

## 2019-04-25 DIAGNOSIS — E039 Hypothyroidism, unspecified: Secondary | ICD-10-CM | POA: Diagnosis not present

## 2019-04-25 DIAGNOSIS — F0390 Unspecified dementia without behavioral disturbance: Secondary | ICD-10-CM | POA: Diagnosis not present

## 2019-04-25 DIAGNOSIS — Y999 Unspecified external cause status: Secondary | ICD-10-CM | POA: Insufficient documentation

## 2019-04-25 DIAGNOSIS — Y939 Activity, unspecified: Secondary | ICD-10-CM | POA: Diagnosis not present

## 2019-04-25 DIAGNOSIS — Z79899 Other long term (current) drug therapy: Secondary | ICD-10-CM | POA: Diagnosis not present

## 2019-04-25 DIAGNOSIS — S80211A Abrasion, right knee, initial encounter: Secondary | ICD-10-CM | POA: Diagnosis not present

## 2019-04-25 DIAGNOSIS — S80212A Abrasion, left knee, initial encounter: Secondary | ICD-10-CM

## 2019-04-25 DIAGNOSIS — L98499 Non-pressure chronic ulcer of skin of other sites with unspecified severity: Secondary | ICD-10-CM | POA: Diagnosis not present

## 2019-04-25 DIAGNOSIS — I10 Essential (primary) hypertension: Secondary | ICD-10-CM | POA: Diagnosis not present

## 2019-04-25 DIAGNOSIS — Z66 Do not resuscitate: Secondary | ICD-10-CM

## 2019-04-25 DIAGNOSIS — W19XXXA Unspecified fall, initial encounter: Secondary | ICD-10-CM

## 2019-04-25 NOTE — ED Notes (Deleted)
Pt verbalized understanding of discharge instructions. NAD at this time. 

## 2019-04-25 NOTE — Discharge Instructions (Addendum)
Casey Andrade was evaluated after apparently falling and sustaining abrasions to his knees.  We understand that he was also apparently agitated at his skilled nursing facility, but he has not been agitated in the emergency department and it is likely that what ever medication was given at his facility is working.  He has no evidence of any acute injuries other than some superficial abrasions to his knees which can be treated twice a day with bacitracin ointment.  He has what appears to be a chronic wound to his right hand but without any evidence of acute infection, and he does not need antibiotics at this time.  I encourage you to schedule the next available follow-up appointment with his primary care provider for reassessment.  Continue with his normal medications this morning and as scheduled.

## 2019-04-25 NOTE — ED Triage Notes (Signed)
Pt from [facility] with agitation. Pt slid out of his recliner tonight per ems, landing on anterior knees, knees have abrasions noted. Pt states "they are mean to me over there, they made me come here". Per ems pt with hospice in place. Pt complains of knee pain, but denies other issues. Pt alert and oriented to self, place, time.

## 2019-04-25 NOTE — ED Notes (Signed)
Pt discharged with EMS. Facility called by previous shift RN and report given. Discharge information sent with pt for facility review. Pt in NAD at this time. Pt unable to sign signature pad due to hx of dementia/confusion.

## 2019-04-25 NOTE — ED Provider Notes (Signed)
[facility] Emergency Department Provider Note  ____________________________________________   First MD Initiated Contact with Patient 04/25/19 [phone]     (approximate)  I have reviewed the triage vital signs and the nursing notes.   HISTORY  Chief Complaint Agitation  Level 5 caveat: The patient's history may be limited by age-related dementia although there is no specific diagnoses listed on his chart of dementia.  HPI Casey Andrade is a 86 y.o. male who was sent from his skilled nursing facility for evaluation of agitation.  Reportedly he was "agitated all day" with the staff.  Tonight he reportedly slid out of his recliner is onto his knees.  The patient says that the staff is mean to him and when he slid out of his recliner he claims that they yelled "get up old man!"   He says that he has some burning on his knees and some pain in his right foot.  He also says that his finger hurts when he indicates his right index finger.  He denies headache, neck pain, chest pain, shortness of breath, nausea, vomiting, and abdominal pain.  He states that the episode of him sliding out of his chair happened acutely and it was just because he slid back in his chair.  He claims that they "made me kneel" in the process of getting him up and "they should not have gone out".  He is alert and oriented to his name, location, and circumstances.  He came with DNR paperwork.     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   Murmur    noted to have loud murmur   Right bundle branch block    Weakness    chronic weakness without syncope    Patient Active Problem List   Diagnosis Date Noted   Altered mental status    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 ([place])    Acute delirium    Palliative care encounter    Sepsis ([place]) 01/04/2019   Transient hypotension 01/04/2019   Encephalopathy 01/04/2019   Mitral valvular regurgitation 08/17/2016   HOCM (hypertrophic obstructive cardiomyopathy) ([place]) 09/14/2010   RBBB (right bundle branch block) 09/14/2010   Hypothyroidism 09/14/2010   Benign hypertensive heart disease without heart failure 09/14/2010    Past Surgical History:  Procedure Laterality Date   TRANSTHORACIC ECHOCARDIOGRAM  04/2010   Vigorous LV function with systolic anterior motion of the anterior mitral valve mild dynamic LVOT obstruction with peak gradient 70 mmHg. EF 65-70%. GR 1 DD. Moderate-severe MR    Prior to Admission medications   Medication Sig Start Date End Date Taking? Authorizing Provider  Biotin 2500 MCG CAPS Take 2,500 mcg by mouth daily.    [provider]  bisacodyl (DULCOLAX) 10 MG suppository Place 10 mg rectally once.    [provider]  Cholecalciferol (VITAMIN D3) 2000 UNITS capsule Take 2,000 Units by mouth. occasionally  [provider]  finasteride (PROSCAR) 5 MG tablet Take 5 mg by mouth at bedtime.    [provider]  losartan (COZAAR) 100 MG tablet TAKE 1 TABLET (100 MG TOTAL) BY MOUTH DAILY. Patient taking differently: Take 50 mg by mouth daily.  05/06/16   Marykay Lex, MD  Melatonin 5 MG TABS Take 5 mg by mouth at bedtime.    [provider]  metoprolol tartrate (LOPRESSOR) 25 MG tablet TAKE 1/2 TABLET BY MOUTH TWICE A DAY Patient taking differently: Take 12.5 mg by mouth 2 (two) times daily.  08/25/17   Marykay Lex, MD  OLANZapine zydis (ZYPREXA) 5 MG  disintegrating tablet Take 0.5 tablets (2.5 mg total) by mouth every 12 (twelve) hours as needed (Insomnia and agitation). 01/12/19 02/11/19  Hughie Closs, MD  risperiDONE (RISPERDAL) 0.5 MG tablet Take 0.5 mg by mouth at bedtime.    [provider]  SYNTHROID 50 MCG tablet Take 50 mcg by mouth daily before breakfast. 06/08/15   [provider]  vitamin B-12 (CYANOCOBALAMIN) 250 MCG tablet Take 250 mcg by mouth daily.      [provider]    Allergies Ativan [lorazepam], Augmentin [amoxicillin-pot clavulanate], Hydrocodone bitartrate [hydrocodone], and Neosporin [bacitracin-polymyxin b]  Family History  Problem Relation Age of Onset   Diabetes Sister     Social History Social History   Tobacco Use   Smoking status: Never Smoker   Smokeless tobacco: Never Used  Substance Use Topics   Alcohol use: No   Drug use: No    Review of Systems Level 5 caveat: The patient's history may be limited by age-related dementia although there is no specific diagnoses listed on his chart of dementia.  Constitutional: No fever/chills Eyes: No visual changes. ENT: No sore throat. Cardiovascular: Denies chest pain. Respiratory: Denies shortness of breath. Gastrointestinal: No abdominal pain.  No nausea, no vomiting.  No diarrhea.  No constipation. Genitourinary: Negative for dysuria. Musculoskeletal: Some burning pain in both knees, pain in right index finger, pain in right foot. Integumentary: Negative for rash.  Abrasions on bilateral knees. Neurological: Negative for headaches, focal weakness or numbness.   ____________________________________________   PHYSICAL EXAM:  VITAL SIGNS: ED Triage Vitals  Enc Vitals Group     BP 04/25/19 0510 (!) 197/77     Pulse Rate 04/25/19 0521 82     Resp 04/25/19 0510 18     Temp 04/25/19 0510 98.6 F (37 C)     Temp Source 04/25/19 0510 Oral     SpO2 04/25/19 0510 97 %     Weight 04/25/19 0505 63.5 kg (140 lb)      Height 04/25/19 0505 1.727 m (5\' 8" )     Head Circumference --      Peak Flow --      Pain Score 04/25/19 0510 6     Pain Loc --      Pain Edu? --      Excl. in GC? --     Constitutional: Alert and oriented.  Elderly and appears chronically ill but is not in acute distress at this time. Eyes: Conjunctivae are normal.  Head: Atraumatic. Nose: No congestion/rhinnorhea. Mouth/Throat: Patient is wearing a mask. Neck: No stridor.  No meningeal signs.   Cardiovascular: Normal rate, regular rhythm. Good peripheral circulation. Grossly normal heart sounds. Respiratory: Normal respiratory effort.  No retractions. Gastrointestinal: Soft and nontender. No distention.  Musculoskeletal: There are no gross deformities of the patient's extremities.  His right foot,  which he indicated has some pain, has no evidence of any infections including no chronic ulcers or evidence of cellulitis.  There are no deformities and I was able to elicit no tenderness to palpation.  I was also able to passively range his knees without any reproducible pain or tenderness in spite of the abrasions on both of them, the right greater than the left. Neurologic:  Normal speech and language. No gross focal neurologic deficits are appreciated.  Skin:  Skin is warm, dry and intact except for some superficial abrasions to both of his knees and what appears to be a chronic ulceration in the webspace between his right thumb and right index finger.  There is no surrounding cellulitis, no drainage, and no erythema.  The patient indicates that the area is tender but this appears to be a chronic wound that is not acutely infected.   ____________________________________________   LABS (all labs ordered are listed, but only abnormal results are displayed)  Labs Reviewed - No data to display ____________________________________________  EKG  No indication for EKG ____________________________________________  RADIOLOGY I, Loleta Rose, personally viewed and evaluated these images (plain radiographs) as part of my medical decision making, as well as reviewing the written report by the radiologist.  ED MD interpretation: No indication for emergent imaging  Official radiology report(s): No results found.  ____________________________________________   PROCEDURES   Procedure(s) performed (including Critical Care):  Procedures   ____________________________________________   INITIAL IMPRESSION / MDM / ASSESSMENT AND PLAN / ED COURSE  As part of my medical decision making, I reviewed the following data within the electronic MEDICAL RECORD NUMBER Nursing notes reviewed and incorporated, Old chart reviewed and Notes from prior ED visits   Differential diagnosis includes, but is not limited to, sundowning, under medication or overmedication, acute infection, musculoskeletal injury after fall.  The patient is in no distress.  He has some obvious abrasions to the knees but no reproducible pain or tenderness with flexion extension of the knees and no reproducible tenderness of his foot.  No indication for imaging given reassuring physical exam.  He has what appears to be a chronic ulceration to the webspace of his right hand between the thumb and index finger but without any evidence of acute or purulent infection and there is some granulation tissue present.  At his age and with his allergies, I am concerned that if I unnecessarily prescribe antibiotics for a chronic wound that is not acutely infected, I am much more likely to cause side effects (including but not limited to C. difficile or delirium) then if the wound is allowed to go untreated as it apparently has been for some time.  He was last admitted to the hospital months ago and on that visit he was hypotensive and febrile, neither of which are the case tonight.  I believe that the patient was agitated and likely had a conflict with the staff after he slid out of his  chair.  He has risperidone as a as needed medication and according to the paramedics that dropped him off, he was given a medication for agitation at the nursing facility before EMS was called.  After my initial assessment, the patient went to sleep and is currently not agitated and asymptomatic.  There is no indication for further work-up or evaluation and I will discharge him back to his nursing facility to see his primary care provider.  I provided my usual and customary return precautions.  ____________________________________________  FINAL CLINICAL IMPRESSION(S) / ED DIAGNOSES  Final diagnoses:  Fall, initial encounter  Abrasion of knee, bilateral  Chronic skin ulcer, unspecified ulcer stage (HCC)  DNR (do not resuscitate)     MEDICATIONS GIVEN DURING THIS VISIT:  Medications - No data to display   ED Discharge Orders    None      *Please note:  Casey Andrade was evaluated in Emergency Department on 04/25/2019 for the symptoms described in the history of present illness. He was evaluated in the context of the global COVID-19 pandemic, which necessitated consideration that the patient might be at risk for infection with the SARS-CoV-2 virus that causes COVID-19. Institutional protocols and algorithms that pertain to the evaluation of patients at risk for COVID-19 are in a state of rapid change based on information released by regulatory bodies including the CDC and federal and state organizations. These policies and algorithms were followed during the patient's care in the ED.  Some ED evaluations and interventions may be delayed as a result of limited staffing during the pandemic.*  Note:  This document was prepared using Dragon voice recognition software and may include unintentional dictation errors.   Loleta Rose, MD 04/25/19 [phone]

## 2019-04-25 NOTE — ED Notes (Signed)
Called [facility] and report given to [place]

## 2019-05-04 ENCOUNTER — Inpatient Hospital Stay
Admission: EM | Admit: 2019-05-04 | Discharge: 2019-05-04 | DRG: 682 | Disposition: A | Discharge status: Hospice | Attending: Family Medicine | Admitting: Family Medicine

## 2019-05-04 ENCOUNTER — Other Ambulatory Visit: Payer: Self-pay

## 2019-05-04 ENCOUNTER — Emergency Department

## 2019-05-04 DIAGNOSIS — I10 Essential (primary) hypertension: Secondary | ICD-10-CM

## 2019-05-04 DIAGNOSIS — I160 Hypertensive urgency: Secondary | ICD-10-CM | POA: Diagnosis present

## 2019-05-04 DIAGNOSIS — Z883 Allergy status to other anti-infective agents status: Secondary | ICD-10-CM

## 2019-05-04 DIAGNOSIS — N179 Acute kidney failure, unspecified: Secondary | ICD-10-CM | POA: Diagnosis not present

## 2019-05-04 DIAGNOSIS — R451 Restlessness and agitation: Secondary | ICD-10-CM | POA: Diagnosis present

## 2019-05-04 DIAGNOSIS — F0390 Unspecified dementia without behavioral disturbance: Secondary | ICD-10-CM

## 2019-05-04 DIAGNOSIS — I421 Obstructive hypertrophic cardiomyopathy: Secondary | ICD-10-CM | POA: Diagnosis present

## 2019-05-04 DIAGNOSIS — E039 Hypothyroidism, unspecified: Secondary | ICD-10-CM | POA: Diagnosis present

## 2019-05-04 DIAGNOSIS — Z20822 Contact with and (suspected) exposure to covid-19: Secondary | ICD-10-CM | POA: Diagnosis present

## 2019-05-04 DIAGNOSIS — I4891 Unspecified atrial fibrillation: Secondary | ICD-10-CM

## 2019-05-04 DIAGNOSIS — W19XXXA Unspecified fall, initial encounter: Secondary | ICD-10-CM | POA: Diagnosis present

## 2019-05-04 DIAGNOSIS — G9341 Metabolic encephalopathy: Secondary | ICD-10-CM

## 2019-05-04 DIAGNOSIS — I119 Hypertensive heart disease without heart failure: Secondary | ICD-10-CM | POA: Diagnosis present

## 2019-05-04 DIAGNOSIS — Z66 Do not resuscitate: Secondary | ICD-10-CM | POA: Diagnosis present

## 2019-05-04 DIAGNOSIS — I4510 Unspecified right bundle-branch block: Secondary | ICD-10-CM | POA: Diagnosis present

## 2019-05-04 DIAGNOSIS — R4182 Altered mental status, unspecified: Secondary | ICD-10-CM | POA: Diagnosis not present

## 2019-05-04 DIAGNOSIS — F0391 Unspecified dementia with behavioral disturbance: Secondary | ICD-10-CM

## 2019-05-04 DIAGNOSIS — F05 Delirium due to known physiological condition: Secondary | ICD-10-CM | POA: Diagnosis present

## 2019-05-04 DIAGNOSIS — Z79890 Hormone replacement therapy (postmenopausal): Secondary | ICD-10-CM | POA: Diagnosis not present

## 2019-05-04 DIAGNOSIS — Z885 Allergy status to narcotic agent status: Secondary | ICD-10-CM | POA: Diagnosis not present

## 2019-05-04 DIAGNOSIS — Z79899 Other long term (current) drug therapy: Secondary | ICD-10-CM | POA: Diagnosis not present

## 2019-05-04 DIAGNOSIS — Z881 Allergy status to other antibiotic agents status: Secondary | ICD-10-CM

## 2019-05-04 DIAGNOSIS — R410 Disorientation, unspecified: Secondary | ICD-10-CM

## 2019-05-04 LAB — URINALYSIS, COMPLETE (UACMP) WITH MICROSCOPIC
Bacteria, UA: NONE SEEN
Bilirubin Urine: NEGATIVE
Glucose, UA: NEGATIVE mg/dL
Hgb urine dipstick: NEGATIVE
Ketones, ur: NEGATIVE mg/dL
Leukocytes,Ua: NEGATIVE
Nitrite: NEGATIVE
Protein, ur: 30 mg/dL — AB
Specific Gravity, Urine: 1.016 (ref 1.005–1.030)
pH: 5 (ref 5.0–8.0)

## 2019-05-04 LAB — COMPREHENSIVE METABOLIC PANEL
ALT: 11 U/L (ref 0–44)
AST: 24 U/L (ref 15–41)
Albumin: 3.2 g/dL — ABNORMAL LOW (ref 3.5–5.0)
Alkaline Phosphatase: 77 U/L (ref 38–126)
Anion gap: 10 (ref 5–15)
BUN: 21 mg/dL (ref 8–23)
CO2: 27 mmol/L (ref 22–32)
Calcium: 8.4 mg/dL — ABNORMAL LOW (ref 8.9–10.3)
Chloride: 107 mmol/L (ref 98–111)
Creatinine, Ser: 1.93 mg/dL — ABNORMAL HIGH (ref 0.61–1.24)
GFR calc Af Amer: 33 mL/min — ABNORMAL LOW (ref >60)
GFR calc non Af Amer: 28 mL/min — ABNORMAL LOW (ref >60)
Glucose, Bld: 108 mg/dL — ABNORMAL HIGH (ref 70–99)
Potassium: 3.6 mmol/L (ref 3.5–5.1)
Sodium: 144 mmol/L (ref 135–145)
Total Bilirubin: 0.9 mg/dL (ref 0.3–1.2)
Total Protein: 6 g/dL — ABNORMAL LOW (ref 6.5–8.1)

## 2019-05-04 LAB — CBC WITH DIFFERENTIAL/PLATELET
Abs Immature Granulocytes: 0.01 10*3/uL (ref 0.00–0.07)
Basophils Absolute: 0.1 10*3/uL (ref 0.0–0.1)
Basophils Relative: 1 %
Eosinophils Absolute: 0.2 10*3/uL (ref 0.0–0.5)
Eosinophils Relative: 2 %
HCT: 37.5 % — ABNORMAL LOW (ref 39.0–52.0)
Hemoglobin: 11.9 g/dL — ABNORMAL LOW (ref 13.0–17.0)
Immature Granulocytes: 0 %
Lymphocytes Relative: 18 %
Lymphs Abs: 1.3 10*3/uL (ref 0.7–4.0)
MCH: 31.6 pg (ref 26.0–34.0)
MCHC: 31.7 g/dL (ref 30.0–36.0)
MCV: 99.7 fL (ref 80.0–100.0)
Monocytes Absolute: 0.8 10*3/uL (ref 0.1–1.0)
Monocytes Relative: 10 %
Neutro Abs: 5.2 10*3/uL (ref 1.7–7.7)
Neutrophils Relative %: 69 %
Platelets: 192 10*3/uL (ref 150–400)
RBC: 3.76 MIL/uL — ABNORMAL LOW (ref 4.22–5.81)
RDW: 13.3 % (ref 11.5–15.5)
WBC: 7.5 10*3/uL (ref 4.0–10.5)
nRBC: 0 % (ref 0.0–0.2)

## 2019-05-04 LAB — PROCALCITONIN: Procalcitonin: <0.1 ng/mL

## 2019-05-04 LAB — SARS CORONAVIRUS 2 (TAT 6-24 HRS): SARS Coronavirus 2: NEGATIVE

## 2019-05-04 LAB — TSH: TSH: 1.36 u[IU]/mL (ref 0.350–4.500)

## 2019-05-04 LAB — LACTIC ACID, PLASMA: Lactic Acid, Venous: 1.7 mmol/L (ref 0.5–1.9)

## 2019-05-04 LAB — LIPASE, BLOOD: Lipase: 23 U/L (ref 11–51)

## 2019-05-04 MED ORDER — OLANZAPINE 5 MG PO TBDP: 5.0000 mg | ORAL_TABLET | Freq: Every day | ORAL | Status: DC

## 2019-05-04 MED ORDER — OLANZAPINE 5 MG PO TBDP
10.0000 mg | ORAL_TABLET | Freq: Every day | ORAL | Status: DC
  Administered 2019-05-04: 10 mg via ORAL
  Filled 2019-05-04: qty 2

## 2019-05-04 MED ORDER — ONDANSETRON HCL 4 MG/2ML IJ SOLN: 4.0000 mg | Freq: Four times a day (QID) | INTRAMUSCULAR | Status: DC | PRN

## 2019-05-04 MED ORDER — RISPERIDONE 1 MG PO TABS: 0.5000 mg | ORAL_TABLET | Freq: Every day | ORAL | Status: DC

## 2019-05-04 MED ORDER — SODIUM CHLORIDE 0.45 % IV SOLN: INTRAVENOUS | Status: DC

## 2019-05-04 MED ORDER — SODIUM CHLORIDE 0.9 % IV BOLUS
500.0000 mL | Freq: Once | INTRAVENOUS | Status: AC
  Administered 2019-05-04: 500 mL via INTRAVENOUS

## 2019-05-04 MED ORDER — OLANZAPINE 5 MG PO TABS: 5.0000 mg | ORAL_TABLET | Freq: Two times a day (BID) | ORAL | Status: DC | PRN

## 2019-05-04 MED ORDER — LEVOTHYROXINE SODIUM 50 MCG PO TABS
50.0000 ug | ORAL_TABLET | Freq: Every day | ORAL | Status: DC
  Administered 2019-05-04: 50 ug via ORAL
  Filled 2019-05-04: qty 1

## 2019-05-04 MED ORDER — LORAZEPAM 0.5 MG PO TABS: 0.5000 mg | ORAL_TABLET | Freq: Three times a day (TID) | ORAL | 0 refills | Status: AC | PRN

## 2019-05-04 MED ORDER — METOPROLOL TARTRATE 25 MG PO TABS
25.0000 mg | ORAL_TABLET | Freq: Once | ORAL | Status: AC
  Administered 2019-05-04: 25 mg via ORAL
  Filled 2019-05-04: qty 1

## 2019-05-04 MED ORDER — LOSARTAN POTASSIUM 50 MG PO TABS: 100.0000 mg | ORAL_TABLET | Freq: Every day | ORAL | Status: DC

## 2019-05-04 MED ORDER — MORPHINE SULFATE 20 MG/5ML PO SOLN: 5.0000 mg | ORAL | 0 refills | Status: DC | PRN

## 2019-05-04 MED ORDER — ACETAMINOPHEN 325 MG PO TABS: 650.0000 mg | ORAL_TABLET | ORAL | Status: DC | PRN

## 2019-05-04 MED ORDER — LOSARTAN POTASSIUM 50 MG PO TABS
50.0000 mg | ORAL_TABLET | ORAL | Status: AC
  Administered 2019-05-04: 50 mg via ORAL
  Filled 2019-05-04: qty 1

## 2019-05-04 MED ORDER — METOPROLOL TARTRATE 5 MG/5ML IV SOLN
2.5000 mg | INTRAVENOUS | Status: AC | PRN
  Administered 2019-05-04 (×2): 2.5 mg via INTRAVENOUS
  Filled 2019-05-04 (×2): qty 5

## 2019-05-04 MED ORDER — ASPIRIN EC 81 MG PO TBEC
81.0000 mg | DELAYED_RELEASE_TABLET | Freq: Every day | ORAL | Status: DC
  Administered 2019-05-04: 81 mg via ORAL
  Filled 2019-05-04: qty 1

## 2019-05-04 MED ORDER — METOPROLOL TARTRATE 25 MG PO TABS
12.5000 mg | ORAL_TABLET | Freq: Two times a day (BID) | ORAL | Status: DC
  Administered 2019-05-04: 12.5 mg via ORAL
  Filled 2019-05-04: qty 1

## 2019-05-04 NOTE — H&P (Signed)
History and Physical    Casey ShellRobert M Andrade JXB:[id number] DOB: Jul 22, 1919 DOA: 05/04/2019  PCP: Catha GosselinLittle, Kevin, MD   Patient coming from: assisted living  I have personally briefly reviewed patient's old medical records [facility]  Chief Complaint: Altered mental status, fall  HPI: Casey Andrade is a 86 y.o. male with medical history significant for mild dementia hypothyroidism hypertension who presents to the emergency room for the second time in 2 weeks with altered mental status.  The assisted living facility reported that patient was less interactive than usual and weakness resulting in falls.  At baseline he is very interactive.  History is limited due to patient's altered mental status and is taken mostly from emergency room documentation  ED Course: On arrival in the ER he was afebrile.  Blood pressure was elevated, as high as 199/123, EKG showed new atrial fibrillation with controlled rate.  On his blood work creatinine was elevated at 1.93 above his baseline of 0.7.  Urinalysis unremarkable.  White cell count normal at 7000, hemoglobin 11.9.  He had a CT scan of the head and C-spine which showed no acute traumatic injury.  He was given home meds of losartan and Lopressor in the emergency room.  A palliative care consult was requested from the emergency room.  Hospitalist consulted for admission due to concern for need for higher level of care.  Review of Systems: Unable to obtain due to altered mental status  Past Medical History:  Diagnosis Date  . Dyspnea    increasing  . Essential hypertension   . History of echocardiogram 04/27/2010   EF was in the range of 65-70% / Mitral valve, systolic anterior motion of the anterior mitral chordae               . Hypertension   . Hypertr obst cardiomyop   . Hyponatremia    for which nephrology is seeing him  . Hypothyroidism   . Left ventricular outflow tract obstruction    Left ventricular outflow tract dynamic obstruction of  peak gradient of 70  . Murmur    noted to have loud murmur  . Right bundle branch block   . Weakness    chronic weakness without syncope    Past Surgical History:  Procedure Laterality Date  . TRANSTHORACIC ECHOCARDIOGRAM  04/2010   Vigorous LV function with systolic anterior motion of the anterior mitral valve mild dynamic LVOT obstruction with peak gradient 70 mmHg. EF 65-70%. GR 1 DD. Moderate-severe MR     reports that he has never smoked. He has never used smokeless tobacco. He reports that he does not drink alcohol or use drugs.  Allergies  Allergen Reactions  . Ativan [Lorazepam] Other (See Comments)    Reaction: Over sedation and lethargy.  . Augmentin [Amoxicillin-Pot Clavulanate] Other (See Comments)    Reaction: unknown Did it involve swelling of the face/tongue/throat, SOB, or low BP? Unknown Did it involve sudden or severe rash/hives, skin peeling, or any reaction on the inside of your mouth or nose? Unknown Did you need to seek medical attention at a hospital or doctor's office? Unknown When did it last happen?unknown If all above answers are "NO", may proceed with cephalosporin use.   Marland Kitchen. Hydrocodone Bitartrate [Hydrocodone] Other (See Comments)    Reaction: unknown  . Neosporin [Bacitracin-Polymyxin B] Other (See Comments)    Reaction: unknown    Family History  Problem Relation Age of Onset  . Diabetes Sister  Prior to Admission medications   Medication Sig Start Date End Date Taking? Authorizing Provider  Biotin 2500 MCG CAPS Take 2,500 mcg by mouth daily.   Yes [provider]  Cholecalciferol (VITAMIN D3) 2000 UNITS capsule Take 2,000 Units by mouth.    Yes [provider]  cholestyramine (QUESTRAN) 4 g packet Take 4 g by mouth as needed (constipation).   Yes [provider]  finasteride (PROSCAR) 5 MG tablet Take 5 mg by mouth daily.    Yes [provider]  guaiFENesin (MUCINEX) 600 MG 12 hr tablet Take 600 mg  by mouth 2 (two) times daily as needed for cough (secretions).   Yes [provider]  levothyroxine (SYNTHROID) 50 MCG tablet Take 50 mcg by mouth daily before breakfast.   Yes [provider]  loperamide (IMODIUM) 2 MG capsule Take 2-4 mg by mouth as needed for diarrhea or loose stools. Take 4mg  after first loose stool, then 2mg  after each subsequent loose stool. Do not exceed 8mg  in 24 hours.   Yes [provider]  losartan (COZAAR) 100 MG tablet TAKE 1 TABLET (100 MG TOTAL) BY MOUTH DAILY. 05/06/16  Yes , MD  Melatonin 5 MG TABS Take 5 mg by mouth at bedtime.   Yes [provider]  meloxicam (MOBIC) 7.5 MG tablet Take 7.5 mg by mouth daily.   Yes [provider]  metoprolol tartrate (LOPRESSOR) 25 MG tablet TAKE 1/2 TABLET BY MOUTH TWICE A DAY Patient taking differently: Take 12.5 mg by mouth 2 (two) times daily.  08/25/17  Yes 05/08/16, MD  Multiple Vitamin (MULTIVITAMIN WITH MINERALS) TABS tablet Take 1 tablet by mouth daily.   Yes [provider]  OLANZapine (ZYPREXA) 5 MG tablet Take 5 mg by mouth every 12 (twelve) hours as needed (agitation).   Yes [provider]  OLANZapine zydis (ZYPREXA) 5 MG disintegrating tablet Take 5 mg by mouth at bedtime.   Yes [provider]  OLANZapine zydis (ZYPREXA) 5 MG disintegrating tablet Take 10 mg by mouth daily.   Yes [provider]  promethazine (PHENERGAN) 12.5 MG tablet Take 12.5 mg by mouth daily with supper.   Yes [provider]  risperiDONE (RISPERDAL) 0.5 MG tablet Take 0.5 mg by mouth at bedtime.   Yes [provider]  triamcinolone cream (KENALOG) 0.1 % Apply 1 application topically 2 (two) times daily. Apply sparingly to both legs   Yes [provider]  vitamin B-12 (CYANOCOBALAMIN) 250 MCG tablet Take 250 mcg by mouth daily.     Yes [provider]    Physical Exam: Vitals:   05/04/19 0158 05/04/19  0244 05/04/19 0245 05/04/19 0335  BP:  (!) 189/99  (!) 170/103  Pulse:   91 95  Resp:  14 17 17   Temp:      TempSrc:      SpO2:   97% 99%  Weight: 63.5 kg     Height: 5\' 8"  (1.727 m)        Vitals:   05/04/19 0158 05/04/19 0244 05/04/19 0245 05/04/19 0335  BP:  (!) 189/99  (!) 170/103  Pulse:   91 95  Resp:  14 17 17   Temp:      TempSrc:      SpO2:   97% 99%  Weight: 63.5 kg     Height: 5\' 8"  (1.727 m)       Constitutional: awake but confused and restless, not answering questions Eyes: PERRL,  lids and conjunctivae normal ENMT: Mucous membranes are moist.  Neck: normal, supple, no masses, no thyromegaly Respiratory: clear to auscultation bilaterally, no wheezing, no crackles. Normal respiratory effort. No accessory muscle use.  Cardiovascular: irregular, no murmurs / rubs / gallops. No extremity edema. 2+ pedal pulses. No carotid bruits.  Abdomen: no tenderness, no masses palpated. No hepatosplenomegaly. Bowel sounds positive.  Musculoskeletal: no clubbing / cyanosis. No joint deformity upper and lower extremities.  Skin: several abrasions in different stages of healing Neurologic: No gross focal neurologic deficit. Psychiatric: restless. Limited due to confusion   Labs on Admission: I have personally reviewed following labs and imaging studies  CBC: Recent Labs  Lab 05/04/19 0223  WBC 7.5  NEUTROABS 5.2  HGB 11.9*  HCT 37.5*  MCV 99.7  PLT 192   Basic Metabolic Panel: Recent Labs  Lab 05/04/19 0223  NA 144  K 3.6  CL 107  CO2 27  GLUCOSE 108*  BUN 21  CREATININE 1.93*  CALCIUM 8.4*   GFR: Estimated Creatinine Clearance: 19.6 mL/min (A) (by C-G formula based on SCr of 1.93 mg/dL (H)). Liver Function Tests: Recent Labs  Lab 05/04/19 0223  AST 24  ALT 11  ALKPHOS 77  BILITOT 0.9  PROT 6.0*  ALBUMIN 3.2*   Recent Labs  Lab 05/04/19 0223  LIPASE 23   No results for input(s): AMMONIA in the last 168 hours. Coagulation Profile: No results  for input(s): INR, PROTIME in the last 168 hours. Cardiac Enzymes: No results for input(s): CKTOTAL, CKMB, CKMBINDEX, TROPONINI in the last 168 hours. BNP (last 3 results) No results for input(s): PROBNP in the last 8760 hours. HbA1C: No results for input(s): HGBA1C in the last 72 hours. CBG: No results for input(s): GLUCAP in the last 168 hours. Lipid Profile: No results for input(s): CHOL, HDL, LDLCALC, TRIG, CHOLHDL, LDLDIRECT in the last 72 hours. Thyroid Function Tests: No results for input(s): TSH, T4TOTAL, FREET4, T3FREE, THYROIDAB in the last 72 hours. Anemia Panel: No results for input(s): VITAMINB12, FOLATE, FERRITIN, TIBC, IRON, RETICCTPCT in the last 72 hours. Urine analysis:    Component Value Date/Time   COLORURINE YELLOW (A) 05/04/2019 0223   APPEARANCEUR HAZY (A) 05/04/2019 0223   LABSPEC 1.016 05/04/2019 0223   PHURINE 5.0 05/04/2019 0223   GLUCOSEU NEGATIVE 05/04/2019 0223   HGBUR NEGATIVE 05/04/2019 0223   BILIRUBINUR NEGATIVE 05/04/2019 0223   KETONESUR NEGATIVE 05/04/2019 0223   PROTEINUR 30 (A) 05/04/2019 0223   NITRITE NEGATIVE 05/04/2019 0223   LEUKOCYTESUR NEGATIVE 05/04/2019 0223    Radiological Exams on Admission: CT Head Wo Contrast  Result Date: 05/04/2019 CLINICAL DATA:  Initial evaluation for acute trauma. EXAM: CT HEAD WITHOUT CONTRAST CT CERVICAL SPINE WITHOUT CONTRAST TECHNIQUE: Multidetector CT imaging of the head and cervical spine was performed following the standard protocol without intravenous contrast. Multiplanar CT image reconstructions of the cervical spine were also generated. COMPARISON:  Prior study from 09/15/2018. FINDINGS: CT HEAD FINDINGS Brain: Examination moderately degraded by motion artifact. Generalized age-related cerebral atrophy with mild chronic small vessel ischemic disease. No acute intracranial hemorrhage. No acute large vessel territory infarct. No mass lesion or midline shift. No hydrocephalus. No visible extra-axial  collection, although evaluation limited by motion. Vascular: No hyperdense vessel. Skull: No acute scalp soft tissue injury.  Calvarium grossly intact. Sinuses/Orbits: Globes and orbital soft tissues grossly unremarkable, although evaluation limited by motion. Paranasal sinuses and mastoid air cells are clear. Other: None. CT CERVICAL SPINE FINDINGS Alignment: Examination technically limited by  motion. Mild exaggeration of the normal cervical lordosis. Trace grade 1 anterolisthesis of C7 on T1 and T1 on T2, chronic and facet mediated. Skull base and vertebrae: Skull base intact. Normal C1-2 articulations are preserved in the dens is intact. Vertebral body height maintained. No acute fracture. Soft tissues and spinal canal: Visualized soft tissues of the neck demonstrate no acute finding. No abnormal prevertebral edema. Prominent vascular calcifications noted about the carotid bifurcations. Enlarged multinodular right lobe of thyroid, felt to be of little clinical significance given patient's advanced age. No follow-up imaging recommended. Disc levels: Moderate multilevel cervical spondylosis, most notable at C5-6 and C6-7. Upper chest: Visualized upper chest demonstrates no acute finding. Visualized lung apices are clear. Other: None. IMPRESSION: CT BRAIN: 1. Motion degraded exam. 2. No acute intracranial abnormality identified. 3. Age-related cerebral atrophy with mild chronic small vessel ischemic disease. CT CERVICAL SPINE: 1. Motion degraded exam. 2. No acute traumatic injury within the cervical spine. 3. Moderate multilevel cervical spondylosis, most notable at C5-6 and C6-7. Electronically Signed   By: Jeannine Boga M.D.   On: 05/04/2019 03:34   CT Cervical Spine Wo Contrast  Result Date: 05/04/2019 CLINICAL DATA:  Initial evaluation for acute trauma. EXAM: CT HEAD WITHOUT CONTRAST CT CERVICAL SPINE WITHOUT CONTRAST TECHNIQUE: Multidetector CT imaging of the head and cervical spine was performed  following the standard protocol without intravenous contrast. Multiplanar CT image reconstructions of the cervical spine were also generated. COMPARISON:  Prior study from 09/15/2018. FINDINGS: CT HEAD FINDINGS Brain: Examination moderately degraded by motion artifact. Generalized age-related cerebral atrophy with mild chronic small vessel ischemic disease. No acute intracranial hemorrhage. No acute large vessel territory infarct. No mass lesion or midline shift. No hydrocephalus. No visible extra-axial collection, although evaluation limited by motion. Vascular: No hyperdense vessel. Skull: No acute scalp soft tissue injury.  Calvarium grossly intact. Sinuses/Orbits: Globes and orbital soft tissues grossly unremarkable, although evaluation limited by motion. Paranasal sinuses and mastoid air cells are clear. Other: None. CT CERVICAL SPINE FINDINGS Alignment: Examination technically limited by motion. Mild exaggeration of the normal cervical lordosis. Trace grade 1 anterolisthesis of C7 on T1 and T1 on T2, chronic and facet mediated. Skull base and vertebrae: Skull base intact. Normal C1-2 articulations are preserved in the dens is intact. Vertebral body height maintained. No acute fracture. Soft tissues and spinal canal: Visualized soft tissues of the neck demonstrate no acute finding. No abnormal prevertebral edema. Prominent vascular calcifications noted about the carotid bifurcations. Enlarged multinodular right lobe of thyroid, felt to be of little clinical significance given patient's advanced age. No follow-up imaging recommended. Disc levels: Moderate multilevel cervical spondylosis, most notable at C5-6 and C6-7. Upper chest: Visualized upper chest demonstrates no acute finding. Visualized lung apices are clear. Other: None. IMPRESSION: CT BRAIN: 1. Motion degraded exam. 2. No acute intracranial abnormality identified. 3. Age-related cerebral atrophy with mild chronic small vessel ischemic disease. CT  CERVICAL SPINE: 1. Motion degraded exam. 2. No acute traumatic injury within the cervical spine. 3. Moderate multilevel cervical spondylosis, most notable at C5-6 and C6-7. Electronically Signed   By: Jeannine Boga M.D.   On: 05/04/2019 03:34    EKG: Independently reviewed.  A. fib but no acute ST-T wave changes  Assessment/Plan Principal Problem:   Acute metabolic encephalopathy, in the setting of baseline of dementia -Likely related to dehydration given acute kidney injury -Head CT negative -IV hydration -N.p.o. until more alert -Neurologic checks    New onset atrial fibrillation (HCC) -CHA2DS2-VASc  score of 2 -Due to high fall risk, advanced age, DNR status will continue aspirin instead of systemic anticoagulation for stroke prevention    AKI (acute kidney injury) (HCC) IV hydration Monitor renal function and avoid nephrotoxins    Hypothyroidism Continue home levothyroxine and follow-up TSH    Dementia without behavioral disturbance (HCC) -Continue home meds   hypertensive urgency Patient received losartan and metoprolol in the ER As needed IV antihypertensive if no improvement in blood pressure      DVT prophylaxis: lovenox Code Status: DNR  Family Communication: none  Disposition Plan: Back to previous home environment Consults called: none     Andris Baumann MD [facility]     05/04/2019, 4:20 AM

## 2019-05-04 NOTE — ED Notes (Addendum)
Difficult to obtain accurate blood pressure on patient due to irritability and increased movement despite attempts to calm/keep still  Sitter at bedside

## 2019-05-04 NOTE — ED Notes (Signed)
Yellow fall bracelet and purple DNR bracelet placed on left wrist

## 2019-05-04 NOTE — ED Provider Notes (Signed)
[facility] Emergency Department Provider Note  ____________________________________________   First MD Initiated Contact with Patient 05/04/19 [phone]     (approximate)  I have reviewed the triage vital signs and the nursing notes.   HISTORY  Chief Complaint Fall  Level 5 caveat:  history/ROS limited by chronic dementia  HPI Casey Andrade is a 86 y.o. male with chronic medical history as listed below  which includes dementia who presents by EMS from his assisted living facility for evaluation of agitation at night and an allegedly unwitnessed fall.  I saw the patient for essentially the same presentation about a week ago.  His ED nurse, Jae Dire, called and spoke with representative at [facility] who said that he is sundowning and that the symptoms are getting worse, they are unable to guarantee his safety and that he needs a higher level of care.  They have given him Zyprexa 5 mg x 3 doses and attempt to get him to calm down.  Apparently a primary care doctor or other physician was consulted and they recommended they give him trazodone 100 mg as well in the evenings but they do not have that medication available or it is not specifically been prescribed.  They also reported that they think he hit his head when he fell.  The patient has no specific complaints.  He is a little bit altered but is unclear to determine his baseline.  He is more altered than when I saw him the last time but he is also had 50 mg of Zyprexa tonight.  When I asked him if there is anything I could do for him, he stated "yes, lots of things," but he is unable to provide any examples.  He denies that anything is hurting currently and he denies shortness of breath.        Past Medical History:  Diagnosis Date  . Dyspnea    increasing  . Essential hypertension   . History of echocardiogram 04/27/2010   EF was in the range of 65-70% / Mitral valve, systolic anterior motion of the anterior  mitral chordae               . Hypertension   . Hypertr obst cardiomyop   . Hyponatremia    for which nephrology is seeing him  . Hypothyroidism   . 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 Murmur    noted to have loud murmur  . Right bundle branch block   . Weakness    chronic weakness without syncope    Patient Active Problem List   Diagnosis Date Noted  . Acute metabolic encephalopathy 05/04/2019  . New onset atrial fibrillation (HCC) 05/04/2019  . AKI (acute kidney injury) (HCC) 05/04/2019  . Hypertensive urgency 05/04/2019  . AMS (altered mental status) 05/04/2019  . Altered mental status   . Dementia without behavioral disturbance (HCC)   . Acute delirium   . Palliative care encounter   . Sepsis (HCC) 01/04/2019  . Transient hypotension 01/04/2019  . Encephalopathy 01/04/2019  . Mitral valvular regurgitation 08/17/2016  . HOCM (hypertrophic obstructive cardiomyopathy) (HCC) 09/14/2010  . RBBB (right bundle branch block) 09/14/2010  . Hypothyroidism 09/14/2010  . Benign hypertensive heart disease without heart failure 09/14/2010    Past Surgical History:  Procedure Laterality Date  . TRANSTHORACIC ECHOCARDIOGRAM  04/2010   Vigorous LV function with systolic anterior motion of the anterior  mitral valve mild dynamic LVOT obstruction with peak gradient 70 mmHg. EF 65-70%. GR 1 DD. Moderate-severe MR    Prior to Admission medications   Medication Sig Start Date End Date Taking? Authorizing Provider  Biotin 2500 MCG CAPS Take 2,500 mcg by mouth daily.   Yes [provider]  Cholecalciferol (VITAMIN D3) 2000 UNITS capsule Take 2,000 Units by mouth.    Yes [provider]  cholestyramine (QUESTRAN) 4 g packet Take 4 g by mouth as needed (constipation).   Yes [provider]  finasteride (PROSCAR) 5 MG tablet Take 5 mg by mouth daily.    Yes [provider]   guaiFENesin (MUCINEX) 600 MG 12 hr tablet Take 600 mg by mouth 2 (two) times daily as needed for cough (secretions).   Yes [provider]  levothyroxine (SYNTHROID) 50 MCG tablet Take 50 mcg by mouth daily before breakfast.   Yes [provider]  loperamide (IMODIUM) 2 MG capsule Take 2-4 mg by mouth as needed for diarrhea or loose stools. Take 4mg  after first loose stool, then 2mg  after each subsequent loose stool. Do not exceed 8mg  in 24 hours.   Yes [provider]  losartan (COZAAR) 100 MG tablet TAKE 1 TABLET (100 MG TOTAL) BY MOUTH DAILY. 05/06/16  Yes , MD  Melatonin 5 MG TABS Take 5 mg by mouth at bedtime.   Yes [provider]  meloxicam (MOBIC) 7.5 MG tablet Take 7.5 mg by mouth daily.   Yes [provider]  metoprolol tartrate (LOPRESSOR) 25 MG tablet TAKE 1/2 TABLET BY MOUTH TWICE A DAY Patient taking differently: Take 12.5 mg by mouth 2 (two) times daily.  08/25/17  Yes 05/08/16, MD  Multiple Vitamin (MULTIVITAMIN WITH MINERALS) TABS tablet Take 1 tablet by mouth daily.   Yes [provider]  OLANZapine (ZYPREXA) 5 MG tablet Take 5 mg by mouth every 12 (twelve) hours as needed (agitation).   Yes [provider]  OLANZapine zydis (ZYPREXA) 5 MG disintegrating tablet Take 5 mg by mouth at bedtime.   Yes [provider]  OLANZapine zydis (ZYPREXA) 5 MG disintegrating tablet Take 10 mg by mouth daily.   Yes [provider]  promethazine (PHENERGAN) 12.5 MG tablet Take 12.5 mg by mouth daily with supper.   Yes [provider]  risperiDONE (RISPERDAL) 0.5 MG tablet Take 0.5 mg by mouth at bedtime.   Yes [provider]  triamcinolone cream (KENALOG) 0.1 % Apply 1 application topically 2 (two) times daily. Apply sparingly to both legs   Yes [provider]  vitamin B-12 (CYANOCOBALAMIN) 250 MCG tablet Take 250 mcg by mouth daily.     Yes [provider]     Allergies Ativan [lorazepam], Augmentin [amoxicillin-pot clavulanate], Hydrocodone bitartrate [hydrocodone], and Neosporin [bacitracin-polymyxin b]  Family History  Problem Relation Age of Onset  . Diabetes Sister     Social History Social History   Tobacco Use  . Smoking status: Never Smoker  . Smokeless tobacco: Never Used  Substance Use Topics  . Alcohol use: No  . Drug use: No    Review of Systems Level 5 caveat: The patient's history may be limited by age-related dementia although there is no specific diagnoses listed on his chart of dementia.   ____________________________________________   PHYSICAL EXAM:  ED Triage Vitals  Enc Vitals Group     BP 05/04/19 0157 (!) 198/89     Pulse Rate 05/04/19 0157 90  Resp 05/04/19 0157 18     Temp 05/04/19 0157 98.5 F (36.9 C)     Temp Source 05/04/19 0157 Oral     SpO2 05/04/19 0157 97 %     Weight 05/04/19 0158 63.5 kg (140 lb)     Height 05/04/19 0158 1.727 m (5\' 8" )     Head Circumference --      Peak Flow --      Pain Score 05/04/19 0158 0     Pain Loc --      Pain Edu? --      Excl. in GC? --      Constitutional: Awake, alert and responds to questions but is not oriented beyond his name. Eyes: Conjunctivae are normal.  Pupils are equal and reactive bilaterally. Head: Multiple subacute probable contusions but no evidence of an acute injury. Nose: No congestion/rhinnorhea. Mouth/Throat: Patient is wearing a mask. Neck: No stridor.  No meningeal signs.   Cardiovascular: Normal rate, regular rhythm. Good peripheral circulation. Grossly normal heart sounds.  Respiratory: Normal respiratory effort.  No retractions. Gastrointestinal: Soft and nontender. No distention.  Musculoskeletal: No lower extremity tenderness nor edema. No gross deformities of extremities.  I was able to passively range all of his extremities without any reproducible pain or tenderness.  He has no pain with manipulation or pressure on  his pelvis.  No chest wall tenderness to palpation. Neurologic: Slightly slurred speech at this time, likely medication related.  Moving all 4 extremities without difficulty but unable to participate in neurological exam. Skin:  Skin is warm and dry.  He has multiple abrasions throughout his extremities including on bilateral lower extremities which were present when I saw him a week ago but it looks like he may have some new ones as well.  He has a chronic ulcer on his right hand between the thumb and index finger which is currently bandaged. Psychiatric: The patient is agitated and redirectable but picking at his pulse oximeter and his blanket, exhibiting signs and symptoms of dementia.  ____________________________________________   LABS (all labs ordered are listed, but only abnormal results are displayed)  Labs Reviewed  COMPREHENSIVE METABOLIC PANEL - Abnormal; Notable for the following components:      Result Value   Glucose, Bld 108 (*)    Creatinine, Ser 1.93 (*)    Calcium 8.4 (*)    Total Protein 6.0 (*)    Albumin 3.2 (*)    GFR calc non Af Amer 28 (*)    GFR calc Af Amer 33 (*)    All other components within normal limits  CBC WITH DIFFERENTIAL/PLATELET - Abnormal; Notable for the following components:   RBC 3.76 (*)    Hemoglobin 11.9 (*)    HCT 37.5 (*)    All other components within normal limits  URINALYSIS, COMPLETE (UACMP) WITH MICROSCOPIC - Abnormal; Notable for the following components:   Color, Urine YELLOW (*)    APPearance HAZY (*)    Protein, ur 30 (*)    All other components within normal limits  URINE CULTURE  SARS CORONAVIRUS 2 (TAT 6-24 HRS)  LACTIC ACID, PLASMA  LIPASE, BLOOD  PROCALCITONIN  TSH   ____________________________________________  EKG  ED ECG REPORT I, Loleta Roseory Roosevelt Eimers, the attending physician, personally viewed and interpreted this ECG.  Date: 05/04/2019 EKG Time: 2:21 AM Rate: 106 Rhythm: A. fib versus borderline sinus  tachycardia, difficult to appreciate with extensive artifact QRS Axis: normal Intervals: Right bundle branch block and left anterior fascicular  block, no measurable P waves ST/T Wave abnormalities: Non-specific ST segment / T-wave changes, but no clear evidence of acute ischemia. Narrative Interpretation: no definitive evidence of acute ischemia; does not meet STEMI criteria.  No prior history of A. fib.   ____________________________________________  RADIOLOGY I, Loleta Rose, personally viewed and evaluated these images (plain radiographs) as part of my medical decision making, as well as reviewing the written report by the radiologist.  ED MD interpretation: No indication for emergent imaging  Official radiology report(s): CT Head Wo Contrast  Result Date: 05/04/2019 CLINICAL DATA:  Initial evaluation for acute trauma. EXAM: CT HEAD WITHOUT CONTRAST CT CERVICAL SPINE WITHOUT CONTRAST TECHNIQUE: Multidetector CT imaging of the head and cervical spine was performed following the standard protocol without intravenous contrast. Multiplanar CT image reconstructions of the cervical spine were also generated. COMPARISON:  Prior study from 09/15/2018. FINDINGS: CT HEAD FINDINGS Brain: Examination moderately degraded by motion artifact. Generalized age-related cerebral atrophy with mild chronic small vessel ischemic disease. No acute intracranial hemorrhage. No acute large vessel territory infarct. No mass lesion or midline shift. No hydrocephalus. No visible extra-axial collection, although evaluation limited by motion. Vascular: No hyperdense vessel. Skull: No acute scalp soft tissue injury.  Calvarium grossly intact. Sinuses/Orbits: Globes and orbital soft tissues grossly unremarkable, although evaluation limited by motion. Paranasal sinuses and mastoid air cells are clear. Other: None. CT CERVICAL SPINE FINDINGS Alignment: Examination technically limited by motion. Mild exaggeration of the normal  cervical lordosis. Trace grade 1 anterolisthesis of C7 on T1 and T1 on T2, chronic and facet mediated. Skull base and vertebrae: Skull base intact. Normal C1-2 articulations are preserved in the dens is intact. Vertebral body height maintained. No acute fracture. Soft tissues and spinal canal: Visualized soft tissues of the neck demonstrate no acute finding. No abnormal prevertebral edema. Prominent vascular calcifications noted about the carotid bifurcations. Enlarged multinodular right lobe of thyroid, felt to be of little clinical significance given patient's advanced age. No follow-up imaging recommended. Disc levels: Moderate multilevel cervical spondylosis, most notable at C5-6 and C6-7. Upper chest: Visualized upper chest demonstrates no acute finding. Visualized lung apices are clear. Other: None. IMPRESSION: CT BRAIN: 1. Motion degraded exam. 2. No acute intracranial abnormality identified. 3. Age-related cerebral atrophy with mild chronic small vessel ischemic disease. CT CERVICAL SPINE: 1. Motion degraded exam. 2. No acute traumatic injury within the cervical spine. 3. Moderate multilevel cervical spondylosis, most notable at C5-6 and C6-7. Electronically Signed   By: Rise Mu M.D.   On: 05/04/2019 03:34   CT Cervical Spine Wo Contrast  Result Date: 05/04/2019 CLINICAL DATA:  Initial evaluation for acute trauma. EXAM: CT HEAD WITHOUT CONTRAST CT CERVICAL SPINE WITHOUT CONTRAST TECHNIQUE: Multidetector CT imaging of the head and cervical spine was performed following the standard protocol without intravenous contrast. Multiplanar CT image reconstructions of the cervical spine were also generated. COMPARISON:  Prior study from 09/15/2018. FINDINGS: CT HEAD FINDINGS Brain: Examination moderately degraded by motion artifact. Generalized age-related cerebral atrophy with mild chronic small vessel ischemic disease. No acute intracranial hemorrhage. No acute large vessel territory infarct. No  mass lesion or midline shift. No hydrocephalus. No visible extra-axial collection, although evaluation limited by motion. Vascular: No hyperdense vessel. Skull: No acute scalp soft tissue injury.  Calvarium grossly intact. Sinuses/Orbits: Globes and orbital soft tissues grossly unremarkable, although evaluation limited by motion. Paranasal sinuses and mastoid air cells are clear. Other: None. CT CERVICAL SPINE FINDINGS Alignment: Examination technically limited by motion. Mild exaggeration  of the normal cervical lordosis. Trace grade 1 anterolisthesis of C7 on T1 and T1 on T2, chronic and facet mediated. Skull base and vertebrae: Skull base intact. Normal C1-2 articulations are preserved in the dens is intact. Vertebral body height maintained. No acute fracture. Soft tissues and spinal canal: Visualized soft tissues of the neck demonstrate no acute finding. No abnormal prevertebral edema. Prominent vascular calcifications noted about the carotid bifurcations. Enlarged multinodular right lobe of thyroid, felt to be of little clinical significance given patient's advanced age. No follow-up imaging recommended. Disc levels: Moderate multilevel cervical spondylosis, most notable at C5-6 and C6-7. Upper chest: Visualized upper chest demonstrates no acute finding. Visualized lung apices are clear. Other: None. IMPRESSION: CT BRAIN: 1. Motion degraded exam. 2. No acute intracranial abnormality identified. 3. Age-related cerebral atrophy with mild chronic small vessel ischemic disease. CT CERVICAL SPINE: 1. Motion degraded exam. 2. No acute traumatic injury within the cervical spine. 3. Moderate multilevel cervical spondylosis, most notable at C5-6 and C6-7. Electronically Signed   By: Jeannine Boga M.D.   On: 05/04/2019 03:34    ____________________________________________   PROCEDURES   Procedure(s) performed (including Critical  Care):  Procedures   ____________________________________________   INITIAL IMPRESSION / MDM / [place] / ED COURSE  As part of my medical decision making, I reviewed the following data within the [place] notes reviewed and incorporated, Labs reviewed , EKG interpreted , Old chart reviewed, Discussed with admitting physician  and Notes from prior ED visits   Differential diagnosis includes, but is not limited to, worsening dementia, delirium from an acute infection including UTI, acute intracranial injury from his fall, fracture or dislocation.  The patient is agitated but not in pain.  He has no shortness of breath and his lungs are clear.  I strongly doubt pneumonia.    At this point there is no clear evidence of an emergent medical condition or a condition that would require hospitalization.  However it is clear that he is declining at his facility and that they no longer have the ability to care for him at an assisted living setting.  I am evaluating broadly to look for evidence of emergent medical conditions or conditions requiring inpatient treatment, but I am also ordering palliative care consult (reportedly the patient may be a hospice patient), transitional care consult, and PT/OT consults to facilitate placement in a skilled nursing facility or memory care unit.     Clinical Course as of May 03 698  Tue May 04, 2019  0208 I am ordering a sitter given his agitation and my concern that he may be an increased fall risk.   [CF]  0208 He is hypertensive but he has a history of essential hypertension and he is agitated.  It appears that he is taking his blood pressure medicine differently than it was originally prescribed.  I will order a dose of oral meds as listed below.  BP(!): 198/89 [CF]  0331 No evidence of infection on urinalysis.  CBC is generally normal.   [CF]  0331 Comprehensive metabolic panel is notable for a substantially elevated  creatinine of about 2 in the setting of a baseline which is around 0.7.  This is indicative of acute renal failure and could explain some of his worsening mental status.  Additionally he seems to have new onset atrial fibrillation and uncontrolled hypertension.  Given the combination of findings, I will consult the hospitalist for admission.  He was still  likely need placement once the acute issues have been addressed but he needs to be medically cleared first.  Comprehensive metabolic panel(!) [CF]  [phone] Discussed with Dr. Para Marchuncan who will admit.   [CF]    Clinical Course User Index [CF] Loleta RoseForbach, Larenda Reedy, MD    ____________________________________________  FINAL CLINICAL IMPRESSION(S) / ED DIAGNOSES  Final diagnoses:  Dementia with behavioral disturbance, unspecified dementia type (HCC)  Delirium  Acute renal failure, unspecified acute renal failure type (HCC)  New onset atrial fibrillation (HCC)  Uncontrolled hypertension     MEDICATIONS GIVEN DURING THIS VISIT:  Medications  acetaminophen (TYLENOL) tablet 650 mg (has no administration in time range)  ondansetron (ZOFRAN) injection 4 mg (has no administration in time range)  0.45 % sodium chloride infusion ( Intravenous New Bag/Given 05/04/19 0513)  aspirin EC tablet 81 mg (has no administration in time range)  losartan (COZAAR) tablet 100 mg (has no administration in time range)  metoprolol tartrate (LOPRESSOR) tablet 12.5 mg (has no administration in time range)  levothyroxine (SYNTHROID) tablet 50 mcg (has no administration in time range)  metoprolol tartrate (LOPRESSOR) injection 2.5 mg (2.5 mg Intravenous Given 05/04/19 0506)  losartan (COZAAR) tablet 50 mg (50 mg Oral Given 05/04/19 0302)  metoprolol tartrate (LOPRESSOR) tablet 25 mg (25 mg Oral Given 05/04/19 0302)  sodium chloride 0.9 % bolus 500 mL (0 mLs Intravenous Stopped 05/04/19 0504)     ED Discharge Orders         Ordered    Amb referral to [facility]     05/04/19  0419          *Please note:  Edwyna ShellRobert M Bourquin was evaluated in Emergency Department on 05/04/2019 for the symptoms described in the history of present illness. He was evaluated in the context of the global COVID-19 pandemic, which necessitated consideration that the patient might be at risk for infection with the SARS-CoV-2 virus that causes COVID-19. Institutional protocols and algorithms that pertain to the evaluation of patients at risk for COVID-19 are in a state of rapid change based on information released by regulatory bodies including the CDC and federal and state organizations. These policies and algorithms were followed during the patient's care in the ED.  Some ED evaluations and interventions may be delayed as a result of limited staffing during the pandemic.*  Note:  This document was prepared using Dragon voice recognition software and may include unintentional dictation errors.   Loleta RoseForbach, Trinidee Schrag, MD 05/04/19 0700

## 2019-05-04 NOTE — ED Notes (Signed)
Sitter at bedside. Patient still very confused and agitated

## 2019-05-04 NOTE — TOC Initial Note (Signed)
Transition of Care [facility]) - Initial/Assessment Note    Patient Details  Name: Casey Andrade MRN: [id number] Date of Birth: November 17, 1919  [facility] CM/SW Contact:    Trenton Founds, RN Phone Number: 05/04/2019, 3:58 PM  Clinical Narrative:    RNCM received phone call from Dr. Maryfrances Bunnell requesting follow up information from [facility] and how to contact their personnel.  Contacted Alvino Chapel with [facility] and she will contact MD. Placed call to patient's family who relays there desires for patient to return to the facility.  RNCM received return call from MD to report he would be returning to facility. Contacted floor RN who reports patient will be transferring back there this evening.          Patient Goals and CMS Choice        Expected Discharge Plan and Services           Expected Discharge Date: 05/04/19                                    Prior Living Arrangements/Services                       Activities of Daily Living      Permission Sought/Granted                  Emotional Assessment              Admission diagnosis:  AMS (altered mental status) [R41.82] Patient Active Problem List   Diagnosis Date Noted  . Acute metabolic encephalopathy 05/04/2019  . New onset atrial fibrillation (HCC) 05/04/2019  . AKI (acute kidney injury) (HCC) 05/04/2019  . Hypertensive urgency 05/04/2019  . AMS (altered mental status) 05/04/2019  . Altered mental status   . Dementia without behavioral disturbance (HCC)   . Acute delirium   . Palliative care encounter   . Sepsis (HCC) 01/04/2019  . Transient hypotension 01/04/2019  . Encephalopathy 01/04/2019  . Mitral valvular regurgitation 08/17/2016  . HOCM (hypertrophic obstructive cardiomyopathy) (HCC) 09/14/2010  . RBBB (right bundle branch block) 09/14/2010  . Hypothyroidism 09/14/2010  . Benign hypertensive heart disease without heart failure 09/14/2010   PCP:  Catha Gosselin, MD Pharmacy:  No Pharmacies Listed    Social Determinants of Health (SDOH) Interventions    Readmission Risk Interventions No flowsheet data found.

## 2019-05-04 NOTE — Discharge Summary (Signed)
Physician Discharge Summary  Casey Andrade LMB:[id number] DOB: 01/07/1920 DOA: 05/04/2019  PCP: Hulan Fess, MD  Admit date: 05/04/2019 Discharge date: 05/04/2019  Ad[facility] From: [facility]  Disposition:  [facility] with [facility]ospice   Recommendations for Outpatient Follow-up:  -Continue goals of care discussions with family and with [facility] -Work with Hospice on EOL care, titrate Zyprexa vs morphine for pain, agitation -- avoid Haldol given patient's previous over-sedation with this medicine    Home Health: Hospice  Equipment/Devices: N/A  Discharge Condition: Declining  CODE STATUS: DNR Diet recommendation: Per previous  Brief/Interim Summary: Casey Andrade is a 86 y.o. M with dementia, non-ambulatory, lives in ALF, as well as HTN and hypothyroidism who presented with slowly progressive delirium and falls.    Per report, patient had been having progressive agitation, unpredictable behavior at night.  He had fallen numerous times, and was forwarded to the ER due to concern for needing a higher level of care.  In the ER, his BP was 200/120 mmHg and creatinine was 1.9 from baseline 0.7 and the patient was confused.           PRINCIPAL HOSPITAL DIAGNOSIS: AKI and delirium    Discharge Diagnoses:  AKI Unclear etiology but likely from diminishing and inadequate oral intake in setting of Mobic and losartan use. Patient given fluids in the ER.  Subsequently, discussed with the patient's son; reviewed his clinical course to date and discussed patient's primary wish to remain with wife, avoid unnecessary medical treatments or work up.  We discussed the possible trajectories of his renal injury, and I am confident that they are representing the patient's wishes that he avoid hospitalization, avoid IV fluids and hospital treatments and allow a natural course.  I have discussed with Hospice, who are in agreement to provide all comfort measures if the patient's clinical  course continues to deteriorate and he approaches the end of life in the next few days.  All DME available in the home.      Delirium This appears to be from AKI in the setting of moderate dementia.  Alternatively, it may be from intracranial hemorrhage, although I am in agreement that further work up with imaging would delay the patient's desired treatment, which is to return home with family and be made comfortable.  Dementia  Hypertensive urgency Avoid excessive medical therapies and allow a natural progression.  Hypothyroidism            Discharge Instructions   Allergies as of 05/04/2019      Reactions   Ativan [lorazepam] Other (See Comments)   Reaction: Over sedation and lethargy.   Augmentin [amoxicillin-pot Clavulanate] Other (See Comments)   Reaction: unknown Did it involve swelling of the face/tongue/throat, SOB, or low BP? Unknown Did it involve sudden or severe rash/hives, skin peeling, or any reaction on the inside of your mouth or nose? Unknown Did you need to seek medical attention at a hospital or doctor's office? Unknown When did it last happen?unknown If all above answers are "NO", may proceed with cephalosporin use.   Hydrocodone Bitartrate [hydrocodone] Other (See Comments)   Reaction: unknown   Neosporin [bacitracin-polymyxin B] Other (See Comments)   Reaction: unknown      Medication List    STOP taking these medications   losartan 100 MG tablet Commonly known as: COZAAR   meloxicam 7.5 MG tablet Commonly known as: MOBIC     TAKE these medications   Biotin 2500 MCG Caps Take 2,500 mcg  by mouth daily.   cholestyramine 4 g packet Commonly known as: QUESTRAN Take 4 g by mouth as needed (constipation).   finasteride 5 MG tablet Commonly known as: PROSCAR Take 5 mg by mouth daily.   guaiFENesin 600 MG 12 hr tablet Commonly known as: MUCINEX Take 600 mg by mouth 2 (two) times daily as needed for cough (secretions).   levothyroxine  50 MCG tablet Commonly known as: SYNTHROID Take 50 mcg by mouth daily before breakfast.   loperamide 2 MG capsule Commonly known as: IMODIUM Take 2-4 mg by mouth as needed for diarrhea or loose stools. Take 4mg  after first loose stool, then 2mg  after each subsequent loose stool. Do not exceed 8mg  in 24 hours.   LORazepam 0.5 MG tablet Commonly known as: Ativan Take 1 tablet (0.5 mg total) by mouth every 8 (eight) hours as needed for anxiety.   Melatonin 5 MG Tabs Take 5 mg by mouth at bedtime.   metoprolol tartrate 25 MG tablet Commonly known as: LOPRESSOR TAKE 1/2 TABLET BY MOUTH TWICE A DAY   morphine 20 MG/5ML solution Take 1.3 mLs (5.2 mg total) by mouth every 2 (two) hours as needed for pain.   multivitamin with minerals Tabs tablet Take 1 tablet by mouth daily.   OLANZapine zydis 5 MG disintegrating tablet Commonly known as: ZYPREXA Take 5 mg by mouth at bedtime.   OLANZapine zydis 5 MG disintegrating tablet Commonly known as: ZYPREXA Take 10 mg by mouth daily.   OLANZapine 5 MG tablet Commonly known as: ZYPREXA Take 5 mg by mouth every 12 (twelve) hours as needed (agitation).   promethazine 12.5 MG tablet Commonly known as: PHENERGAN Take 12.5 mg by mouth daily with supper.   risperiDONE 0.5 MG tablet Commonly known as: RISPERDAL Take 0.5 mg by mouth at bedtime.   triamcinolone cream 0.1 % Commonly known as: KENALOG Apply 1 application topically 2 (two) times daily. Apply sparingly to both legs   vitamin B-12 250 MCG tablet Commonly known as: CYANOCOBALAMIN Take 250 mcg by mouth daily.   Vitamin D3 50 MCG (2000 UT) capsule Take 2,000 Units by mouth.       Allergies  Allergen Reactions  . Ativan [Lorazepam] Other (See Comments)    Reaction: Over sedation and lethargy.  . Augmentin [Amoxicillin-Pot Clavulanate] Other (See Comments)    Reaction: unknown Did it involve swelling of the face/tongue/throat, SOB, or low BP? Unknown Did it involve  sudden or severe rash/hives, skin peeling, or any reaction on the inside of your mouth or nose? Unknown Did you need to seek medical attention at a hospital or doctor's office? Unknown When did it last happen?unknown If all above answers are "NO", may proceed with cephalosporin use.   Hydrocodone Bitartrate [Hydrocodone] Other (See Comments)    Reaction: unknown  . Neosporin [Bacitracin-Polymyxin B] Other (See Comments)    Reaction: unknown    Consultations:     Procedures/Studies: CT Head Wo Contrast  Result Date: 05/04/2019 CLINICAL DATA:  Initial evaluation for acute trauma. EXAM: CT HEAD WITHOUT CONTRAST CT CERVICAL SPINE WITHOUT CONTRAST TECHNIQUE: Multidetector CT imaging of the head and cervical spine was performed following the standard protocol without intravenous contrast. Multiplanar CT image reconstructions of the cervical spine were also generated. COMPARISON:  Prior study from 09/15/2018. FINDINGS: CT HEAD FINDINGS Brain: Examination moderately degraded by motion artifact. Generalized age-related cerebral atrophy with mild chronic small vessel ischemic disease. No acute intracranial hemorrhage. No acute large vessel territory infarct. No mass lesion  or midline shift. No hydrocephalus. No visible extra-axial collection, although evaluation limited by motion. Vascular: No hyperdense vessel. Skull: No acute scalp soft tissue injury.  Calvarium grossly intact. Sinuses/Orbits: Globes and orbital soft tissues grossly unremarkable, although evaluation limited by motion. Paranasal sinuses and mastoid air cells are clear. Other: None. CT CERVICAL SPINE FINDINGS Alignment: Examination technically limited by motion. Mild exaggeration of the normal cervical lordosis. Trace grade 1 anterolisthesis of C7 on T1 and T1 on T2, chronic and facet mediated. Skull base and vertebrae: Skull base intact. Normal C1-2 articulations are preserved in the dens is intact. Vertebral body height maintained. No  acute fracture. Soft tissues and spinal canal: Visualized soft tissues of the neck demonstrate no acute finding. No abnormal prevertebral edema. Prominent vascular calcifications noted about the carotid bifurcations. Enlarged multinodular right lobe of thyroid, felt to be of little clinical significance given patient's advanced age. No follow-up imaging recommended. Disc levels: Moderate multilevel cervical spondylosis, most notable at C5-6 and C6-7. Upper chest: Visualized upper chest demonstrates no acute finding. Visualized lung apices are clear. Other: None. IMPRESSION: CT BRAIN: 1. Motion degraded exam. 2. No acute intracranial abnormality identified. 3. Age-related cerebral atrophy with mild chronic small vessel ischemic disease. CT CERVICAL SPINE: 1. Motion degraded exam. 2. No acute traumatic injury within the cervical spine. 3. Moderate multilevel cervical spondylosis, most notable at C5-6 and C6-7. Electronically Signed   By: Rise MuBenjamin  McClintock M.D.   On: 05/04/2019 03:34   CT Cervical Spine Wo Contrast  Result Date: 05/04/2019 CLINICAL DATA:  Initial evaluation for acute trauma. EXAM: CT HEAD WITHOUT CONTRAST CT CERVICAL SPINE WITHOUT CONTRAST TECHNIQUE: Multidetector CT imaging of the head and cervical spine was performed following the standard protocol without intravenous contrast. Multiplanar CT image reconstructions of the cervical spine were also generated. COMPARISON:  Prior study from 09/15/2018. FINDINGS: CT HEAD FINDINGS Brain: Examination moderately degraded by motion artifact. Generalized age-related cerebral atrophy with mild chronic small vessel ischemic disease. No acute intracranial hemorrhage. No acute large vessel territory infarct. No mass lesion or midline shift. No hydrocephalus. No visible extra-axial collection, although evaluation limited by motion. Vascular: No hyperdense vessel. Skull: No acute scalp soft tissue injury.  Calvarium grossly intact. Sinuses/Orbits: Globes and  orbital soft tissues grossly unremarkable, although evaluation limited by motion. Paranasal sinuses and mastoid air cells are clear. Other: None. CT CERVICAL SPINE FINDINGS Alignment: Examination technically limited by motion. Mild exaggeration of the normal cervical lordosis. Trace grade 1 anterolisthesis of C7 on T1 and T1 on T2, chronic and facet mediated. Skull base and vertebrae: Skull base intact. Normal C1-2 articulations are preserved in the dens is intact. Vertebral body height maintained. No acute fracture. Soft tissues and spinal canal: Visualized soft tissues of the neck demonstrate no acute finding. No abnormal prevertebral edema. Prominent vascular calcifications noted about the carotid bifurcations. Enlarged multinodular right lobe of thyroid, felt to be of little clinical significance given patient's advanced age. No follow-up imaging recommended. Disc levels: Moderate multilevel cervical spondylosis, most notable at C5-6 and C6-7. Upper chest: Visualized upper chest demonstrates no acute finding. Visualized lung apices are clear. Other: None. IMPRESSION: CT BRAIN: 1. Motion degraded exam. 2. No acute intracranial abnormality identified. 3. Age-related cerebral atrophy with mild chronic small vessel ischemic disease. CT CERVICAL SPINE: 1. Motion degraded exam. 2. No acute traumatic injury within the cervical spine. 3. Moderate multilevel cervical spondylosis, most notable at C5-6 and C6-7. Electronically Signed   By: Rise MuBenjamin  McClintock M.D.   On:  05/04/2019 03:34      Subjective: Patient mostly will not eat.  He does not recognize son, which is change from baseline.  He is quiet but appears weak.  No fever, no respiratory distress, no obvious focal movement deficits.  Discharge Exam: Vitals:   05/04/19 0800 05/04/19 0900  BP: (!) 160/102 (!) 171/106  Pulse: 78 75  Resp:    Temp:    SpO2: 96% 95%   Vitals:   05/04/19 0700 05/04/19 0730 05/04/19 0800 05/04/19 0900  BP: (!) 202/136  (!) 179/169 (!) 160/102 (!) 171/106  Pulse: 80 79 78 75  Resp:      Temp:      TempSrc:      SpO2: 100% 97% 96% 95%  Weight:      Height:        General: Pt is  awake, not in acute distress, but inattentive, staring mostly at ceiling, mostly does not follow commands or make eye contact Cardiovascular: RRR, nl S1-S2, no murmurs appreciated.   No LE edema.   Respiratory: Normal respiratory rate and rhythm.  CTAB without rales or wheezes. Abdominal: Abdomen soft and no grimace to palpation.  No distension or HSM.   Neuro/Psych: Strength diminished generally.  Eyes open but does not folow commands or track me.  Responds verbally to questions, but is mostly not coherent in repsonses, other than "yea" or "no"       The results of significant diagnostics from this hospitalization (including imaging, microbiology, ancillary and laboratory) are listed below for reference.     Microbiology: No results found for this or any previous visit (from the past 240 hour(s)).   Labs: BNP (last 3 results) No results for input(s): BNP in the last 8760 hours. Basic Metabolic Panel: Recent Labs  Lab 05/04/19 0223  NA 144  K 3.6  CL 107  CO2 27  GLUCOSE 108*  BUN 21  CREATININE 1.93*  CALCIUM 8.4*   Liver Function Tests: Recent Labs  Lab 05/04/19 0223  AST 24  ALT 11  ALKPHOS 77  BILITOT 0.9  PROT 6.0*  ALBUMIN 3.2*   Recent Labs  Lab 05/04/19 0223  LIPASE 23   No results for input(s): AMMONIA in the last 168 hours. CBC: Recent Labs  Lab 05/04/19 0223  WBC 7.5  NEUTROABS 5.2  HGB 11.9*  HCT 37.5*  MCV 99.7  PLT 192   Cardiac Enzymes: No results for input(s): CKTOTAL, CKMB, CKMBINDEX, TROPONINI in the last 168 hours. BNP: Invalid input(s): POCBNP CBG: No results for input(s): GLUCAP in the last 168 hours. D-Dimer No results for input(s): DDIMER in the last 72 hours. Hgb A1c No results for input(s): HGBA1C in the last 72 hours. Lipid Profile No results for  input(s): CHOL, HDL, LDLCALC, TRIG, CHOLHDL, LDLDIRECT in the last 72 hours. Thyroid function studies Recent Labs    05/04/19 0233  TSH 1.360   Anemia work up No results for input(s): VITAMINB12, FOLATE, FERRITIN, TIBC, IRON, RETICCTPCT in the last 72 hours. Urinalysis    Component Value Date/Time   COLORURINE YELLOW (A) 05/04/2019 0223   APPEARANCEUR HAZY (A) 05/04/2019 0223   LABSPEC 1.016 05/04/2019 0223   PHURINE 5.0 05/04/2019 0223   GLUCOSEU NEGATIVE 05/04/2019 0223   HGBUR NEGATIVE 05/04/2019 0223   BILIRUBINUR NEGATIVE 05/04/2019 0223   KETONESUR NEGATIVE 05/04/2019 0223   PROTEINUR 30 (A) 05/04/2019 0223   NITRITE NEGATIVE 05/04/2019 0223   LEUKOCYTESUR NEGATIVE 05/04/2019 0223   Sepsis Labs Invalid input(s): PROCALCITONIN,  WBC,  LACTICIDVEN Microbiology No results found for this or any previous visit (from the past 240 hour(s)).   Time coordinating discharge: 45 minutes This is a Hospice patient.      SIGNED:   Alberteen Sam, MD  [facility] 05/04/2019, 12:57 PM

## 2019-05-04 NOTE — ED Notes (Signed)
On call hospice nurse called to check on patient.  Call them back at [phone] if needed.

## 2019-05-04 NOTE — ED Notes (Signed)
Sitter at bedside. Pt given busy mat. Pads placed on side rails so pt can't climb through rails. Pillows underneath knees to elevate them.

## 2019-05-04 NOTE — ED Notes (Signed)
Patient able to realize and say "it's taking my blood pressure" and tries to hold still while it's taking but still moves around quite a lot

## 2019-05-04 NOTE — ED Notes (Signed)
Patient transported to CT 

## 2019-05-04 NOTE — ED Notes (Signed)
[name] [phone]

## 2019-05-04 NOTE — ED Triage Notes (Addendum)
Pt arrived from [facility] via [facility] due to a fall and confusion.  PRN meds (15 mg zyprexa) given by staff; staff Fredric Mare) states this did not work.  100mg  of trazadone also ordered prn but unavailable at the facility.  Abrasion of right cheek.  Staff states he may have hit his spine as well.

## 2019-05-04 NOTE — Progress Notes (Signed)
Attempted to call [facility] x2 to let them know of patient's return with no answer. Medical necessity completed. Family currently at bedside to visit with patient, will call EMS for transportation once family leaves.

## 2019-05-04 NOTE — ED Notes (Signed)
Vital signs stable. 

## 2019-05-05 ENCOUNTER — Encounter: Payer: Self-pay | Admitting: Emergency Medicine

## 2019-05-05 ENCOUNTER — Emergency Department
Admission: EM | Admit: 2019-05-05 | Discharge: 2019-05-05 | Disposition: A | Discharge status: Home / Self Care | Payer: Medicare Other | Attending: Emergency Medicine | Admitting: Emergency Medicine

## 2019-05-05 ENCOUNTER — Other Ambulatory Visit: Payer: Self-pay

## 2019-05-05 DIAGNOSIS — I10 Essential (primary) hypertension: Secondary | ICD-10-CM | POA: Diagnosis not present

## 2019-05-05 DIAGNOSIS — E039 Hypothyroidism, unspecified: Secondary | ICD-10-CM | POA: Insufficient documentation

## 2019-05-05 DIAGNOSIS — R0602 Shortness of breath: Secondary | ICD-10-CM

## 2019-05-05 DIAGNOSIS — F0390 Unspecified dementia without behavioral disturbance: Secondary | ICD-10-CM | POA: Diagnosis not present

## 2019-05-05 DIAGNOSIS — Z79899 Other long term (current) drug therapy: Secondary | ICD-10-CM | POA: Insufficient documentation

## 2019-05-05 LAB — URINE CULTURE: Culture: NO GROWTH

## 2019-05-05 MED ORDER — SCOPOLAMINE 1 MG/3DAYS TD PT72: 1.0000 | MEDICATED_PATCH | TRANSDERMAL | 2 refills | Status: AC

## 2019-05-05 MED ORDER — MORPHINE SULFATE 20 MG/5ML PO SOLN: 5.0000 mg | ORAL | 0 refills | Status: AC | PRN

## 2019-05-05 MED ORDER — SCOPOLAMINE 1 MG/3DAYS TD PT72
1.0000 | MEDICATED_PATCH | Freq: Once | TRANSDERMAL | Status: DC
  Administered 2019-05-05: 11:00:00 1.5 mg via TRANSDERMAL
  Filled 2019-05-05: qty 1

## 2019-05-05 NOTE — ED Notes (Addendum)
Spoke with [place] at [facility] who says he was sent over for "shortness of breath and a rattle in his throat". This RN noted that patient was discharged yesterday because family wanted patient to be comfortable and avoid hospitalization. [facility] will fax over his DNR when they are able to locate

## 2019-05-05 NOTE — ED Notes (Signed)
Called [facility] for transport to [facility]  [phone]

## 2019-05-05 NOTE — ED Triage Notes (Signed)
Patient brought in from [facility], staff states patient is more short of breath and has increased sputum. Patient was seen in this ED last night for concerns of increased altered mental status. Patient is 94% on room air

## 2019-05-05 NOTE — ED Notes (Signed)
Attempted to call son and left message explaining that patient is back in emergency room, asked to call back and update on wishes for father. [facility] is faxing DNR

## 2019-05-05 NOTE — Discharge Instructions (Addendum)
The morphine prescription has been modified with the additional indications of agitation and difficulty breathing.  A scopolamine prescription has been provided to help decrease secretions.  Please contact the hospice service for any new or worsening symptoms or acute medical concerns, and return to the emergency department as needed if directed to do so by hospice.

## 2019-05-05 NOTE — ED Notes (Signed)
Multiple attempts to reach [facility] without success

## 2019-05-05 NOTE — ED Notes (Signed)
Ems arrived to transport pt back to [facility] at this time

## 2019-05-05 NOTE — ED Provider Notes (Signed)
[facility] Emergency Department Provider Note ____________________________________________   First MD Initiated Contact with Patient 05/05/19 [phone]     (approximate)  I have reviewed the triage vital signs and the nursing notes.   HISTORY  Chief Complaint Shortness of Breath  Level 5 caveat: History of present illness limited due to dementia and altered mental status  HPI Casey Andrade is a 86 y.o. male with PMH as noted below who presents from his assisted living facility for shortness of breath and a "rattle" in his chest over the last day.  The patient was seen in the ED early yesterday morning with altered mental status and frequent falls, with a concern that he needed a higher level of care because he is at [facility], an assisted living facility.  The patient is DNR and is on hospice care currently.  Work-up revealed AKI and the patient was admitted overnight.  He was discharged yesterday after discussion with the family about goals of care.  Past Medical History:  Diagnosis Date  . Dyspnea    increasing  . Essential hypertension   . History of echocardiogram 04/27/2010   EF was in the range of 65-70% / Mitral valve, systolic anterior motion of the anterior mitral chordae               . Hypertension   . Hypertr obst cardiomyop   . Hyponatremia    for which nephrology is seeing him  . Hypothyroidism   . 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 Murmur    noted to have loud murmur  . Right bundle branch block   . Weakness    chronic weakness without syncope    Patient Active Problem List   Diagnosis Date Noted  . Acute metabolic encephalopathy 05/04/2019  . New onset atrial fibrillation (HCC) 05/04/2019  . AKI (acute kidney injury) (HCC) 05/04/2019  . Hypertensive urgency 05/04/2019  . AMS (altered mental status) 05/04/2019  . Altered mental status   .  Dementia without behavioral disturbance (HCC)   . Acute delirium   . Palliative care encounter   . Sepsis (HCC) 01/04/2019  . Transient hypotension 01/04/2019  . Encephalopathy 01/04/2019  . Mitral valvular regurgitation 08/17/2016  . HOCM (hypertrophic obstructive cardiomyopathy) (HCC) 09/14/2010  . RBBB (right bundle branch block) 09/14/2010  . Hypothyroidism 09/14/2010  . Benign hypertensive heart disease without heart failure 09/14/2010    Past Surgical History:  Procedure Laterality Date  . TRANSTHORACIC ECHOCARDIOGRAM  04/2010   Vigorous LV function with systolic anterior motion of the anterior mitral valve mild dynamic LVOT obstruction with peak gradient 70 mmHg. EF 65-70%. GR 1 DD. Moderate-severe MR    Prior to Admission medications   Medication Sig Start Date End Date Taking? Authorizing Provider  Biotin 2500 MCG CAPS Take 2,500 mcg by mouth daily.    [provider]  Cholecalciferol (VITAMIN D3) 2000 UNITS capsule Take 2,000 Units by mouth.     [provider]  cholestyramine (QUESTRAN) 4 g packet Take 4 g by mouth as needed (constipation).    [provider]  finasteride (PROSCAR) 5 MG tablet Take 5 mg by mouth daily.     [provider]  guaiFENesin (MUCINEX) 600 MG 12 hr tablet Take 600 mg by mouth 2 (two) times daily as needed for cough (secretions).    [provider]  levothyroxine (SYNTHROID) 50 MCG tablet  Take 50 mcg by mouth daily before breakfast.    [provider]  loperamide (IMODIUM) 2 MG capsule Take 2-4 mg by mouth as needed for diarrhea or loose stools. Take 4mg  after first loose stool, then 2mg  after each subsequent loose stool. Do not exceed 8mg  in 24 hours.    [provider]  LORazepam (ATIVAN) 0.5 MG tablet Take 1 tablet (0.5 mg total) by mouth every 8 (eight) hours as needed for anxiety. 05/04/19 05/03/20  Danford , MD  Melatonin 5 MG TABS Take 5 mg by mouth at bedtime.     [provider]  metoprolol tartrate (LOPRESSOR) 25 MG tablet TAKE 1/2 TABLET BY MOUTH TWICE A DAY Patient taking differently: Take 12.5 mg by mouth 2 (two) times daily.  08/25/17   07/01/20, MD  morphine 20 MG/5ML solution Take 1.3 mLs (5.2 mg total) by mouth every 2 (two) hours as needed for pain (or agitation, or breathing difficulty). 05/05/19   10/25/17, MD  Multiple Vitamin (MULTIVITAMIN WITH MINERALS) TABS tablet Take 1 tablet by mouth daily.    [provider]  OLANZapine (ZYPREXA) 5 MG tablet Take 5 mg by mouth every 12 (twelve) hours as needed (agitation).    [provider]  OLANZapine zydis (ZYPREXA) 5 MG disintegrating tablet Take 5 mg by mouth at bedtime.    [provider]  OLANZapine zydis (ZYPREXA) 5 MG disintegrating tablet Take 10 mg by mouth daily.    [provider]  promethazine (PHENERGAN) 12.5 MG tablet Take 12.5 mg by mouth daily with supper.    [provider]  risperiDONE (RISPERDAL) 0.5 MG tablet Take 0.5 mg by mouth at bedtime.    [provider]  scopolamine (TRANSDERM-SCOP, 1.5 MG,) 1 MG/3DAYS Place 1 patch (1.5 mg total) onto the skin every 3 (three) days. 05/05/19   05/07/19, MD  triamcinolone cream (KENALOG) 0.1 % Apply 1 application topically 2 (two) times daily. Apply sparingly to both legs    [provider]  vitamin B-12 (CYANOCOBALAMIN) 250 MCG tablet Take 250 mcg by mouth daily.      [provider]    Allergies Ativan [lorazepam], Augmentin [amoxicillin-pot clavulanate], Hydrocodone bitartrate [hydrocodone], and Neosporin [bacitracin-polymyxin b]  Family History  Problem Relation Age of Onset  . Diabetes Sister     Social History Social History   Tobacco Use  . Smoking status: Never Smoker  . Smokeless tobacco: Never Used  Substance Use Topics  . Alcohol use: No  . Drug use: No    Review of Systems Level 5 caveat: Unable to obtain  review of systems due to dementia/altered mental status    ____________________________________________   PHYSICAL EXAM:  VITAL SIGNS: ED Triage Vitals  Enc Vitals Group     BP 05/05/19 0653 (!) 130/95     Pulse Rate 05/05/19 0623 (!) 115     Resp 05/05/19 0643 14     Temp 05/05/19 0623 99.4 F (37.4 C)     Temp Source 05/05/19 0623 Axillary     SpO2 05/05/19 0623 96 %     Weight 05/05/19 0633 139 lb 15.9 oz (63.5 kg)     Height 05/05/19 0633 5\' 8"  (1.727 m)     Head Circumference --      Peak Flow --      Pain Score --      Pain Loc --      Pain Edu? --      Excl.  in [place]? --     Constitutional: Somnolent.  Weak and frail appearing but in no acute distress. Eyes: Conjunctivae are normal.  Head: Atraumatic. Nose: No congestion/rhinnorhea. Mouth/Throat: Mucous membranes are slightly dry. Neck: Normal range of motion.  Cardiovascular: Normal rate, regular rhythm. Grossly normal heart sounds.  Good peripheral circulation. Respiratory: Normal respiratory effort.  No retractions.  Coarse breath sounds bilaterally. Gastrointestinal: Soft and nontender. No distention.  Genitourinary: No flank tenderness. Musculoskeletal: No lower extremity edema.  Extremities warm and well perfused.  Neurologic: Motor intact in all extremities. Skin:  Skin is warm and dry. No rash noted. Psychiatric: Unable to assess.  ____________________________________________   LABS (all labs ordered are listed, but only abnormal results are displayed)  Labs Reviewed - No data to display ____________________________________________  EKG  ED ECG REPORT I, Arta Silence, the attending physician, personally viewed and interpreted this ECG.  Date: 05/05/2019 EKG Time: 0622 Rate: 121 Rhythm: Sinus tachycardia QRS Axis: normal Intervals: RBBB ST/T Wave abnormalities: normal Narrative Interpretation: EKG limited due to poor quality baseline, but with no acute  abnormalities  ____________________________________________  RADIOLOGY    ____________________________________________   PROCEDURES  Procedure(s) performed: No  Procedures  Critical Care performed: No ____________________________________________   INITIAL IMPRESSION / ASSESSMENT AND PLAN / ED COURSE  Pertinent labs & imaging results that were available during my care of the patient were reviewed by me and considered in my medical decision making (see chart for details).  86 year old male with PMH as noted above presents from his assisted living facility for shortness of breath and a "rattle" in his chest.  The patient is unable to voice any specific complaints.  I reviewed the past medical records in epic.  The patient was seen in the ED early yesterday morning due to altered mental status and increased falls, with a concern from his assisted living facility that he may need additional care.  Work-up revealed AKI.  The hospitalist Dr. Loleta Books discussed the patient's care with his family, and confirmed that the family wanted comfort care only, avoiding hospitalization and hospital treatment such as IV fluids, which would be consistent with the patient's wishes.  From Dr. Sabino Niemann discharge summary:  Subsequently, discussed with the patient's son; reviewed his clinical course to date and discussed patient's primary wish to remain with wife, avoid unnecessary medical treatments or work up.  We discussed the possible trajectories of his renal injury, and I am confident that they are representing the patient's wishes that he avoid hospitalization, avoid IV fluids and hospital treatments and allow a natural course.  I have discussed with Hospice, who are in agreement to provide all comfort measures if the patient's clinical course continues to deteriorate and he approaches the end of life in the next few days.  On exam today, the patient is somnolent and appears weak but comfortable.   His vital signs are normal.  O2 saturation is in the mid 90s on room air and he does not demonstrate significantly increased work of breathing or respiratory distress.  The remainder of the exam is unremarkable for acute findings.  The patient is DNR and on hospice.  RN attempted to contact the son although did not receive an immediate answer.  Consistent with the patient and family's wishes as documented in the notes from yesterday, at this time, there is no indication for any ED work-up or intervention.  There appears to be a discrepancy or miscommunication between the family's wishes and goals of care and the assisted  living facility.  We will continue to attempt to contact the family and communicate with the hospice program and the assisted living facility to determine appropriate disposition for the patient.  ----------------------------------------- 10:22 AM on 05/05/2019 -----------------------------------------  I have now had extensive discussions with 3 of the patient's children, his daughter Casey Andrade who is present here in the ED, as well as over the phone with Casey Andrade who is the power of attorney, and his son Casey Hua.  I had initially put in orders for palliative and transition of care team consults. However, after speaking to the family I confirmed that the patient already has hospice on board, and that the family had a meeting with [facility] yesterday in which the goals of care were outlined. Casey Hua feels that there may have been a miscommunication with the overnight staff leading to the patient being brought to the hospital rather than consulting with hospice or the family.  At the request of the family, I added additional indications for the morphine that has been prescribed. I also suggested a scopolamine patch to help with secretions.  The patient continues to be sleeping comfortably. His vital signs remain normal. O2 saturation is in the mid 90s on room air. The family did have some  questions about potentially suctioning the patient, however he does not have any abnormal or wet breath sounds, any apparent difficulty breathing, or other indications for suction, and given that he is oxygenating normally the risk of gagging, vomiting, and aspiration outweighs any potential benefit of suctioning at this time.  At this time, the patient is stable for discharge home. I discussed the importance of communicating with the hospice service, as well as return precautions and appropriate indications for him returning to the hospital. All the family members I spoke to expressed understanding. I answered all their questions to the best my ability.  ____________________________________________   FINAL CLINICAL IMPRESSION(S) / ED DIAGNOSES  Final diagnoses:  Shortness of breath      NEW MEDICATIONS STARTED DURING THIS VISIT:  New Prescriptions   SCOPOLAMINE (TRANSDERM-SCOP, 1.5 MG,) 1 MG/3DAYS    Place 1 patch (1.5 mg total) onto the skin every 3 (three) days.     Note:  This document was prepared using Dragon voice recognition software and may include unintentional dictation errors.   Dionne Bucy, MD 05/05/19 1027

## 2019-05-24 DEATH — deceased

## 2021-07-22 IMAGING — CT CT HEAD CODE STROKE
3 series · 14 of 47 positions shown, 16 images · non-contrast
Comparison: Head CT 09/15/2018

CLINICAL DATA: Code stroke. Altered level of consciousness,
unexplained. Last known normal 11 a.m., left-sided weakness.

EXAM:
CT HEAD WITHOUT CONTRAST
TECHNIQUE: Contiguous axial images were obtained from the base of the skull
through the vertex without intravenous contrast.

[Series 3: head 5.0 st · axial · 0.43mm/px · z∈[-121,+14]mm · 8 of 33 slices shown, 10 images]
[im 3/33  brain]
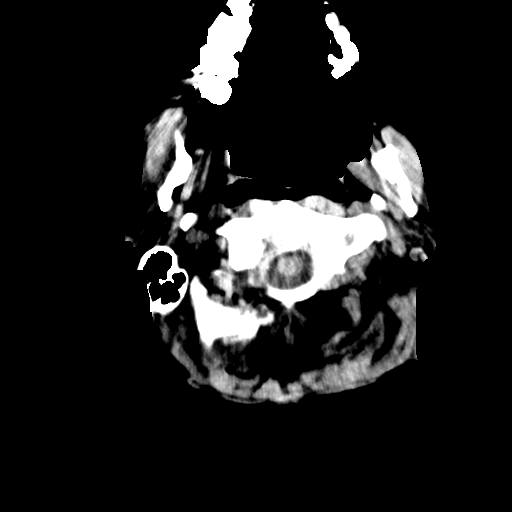
[im 3/33  bone]
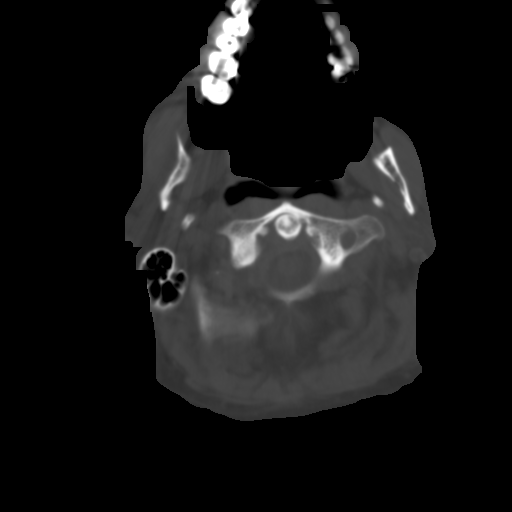
[im 7/33  brain]
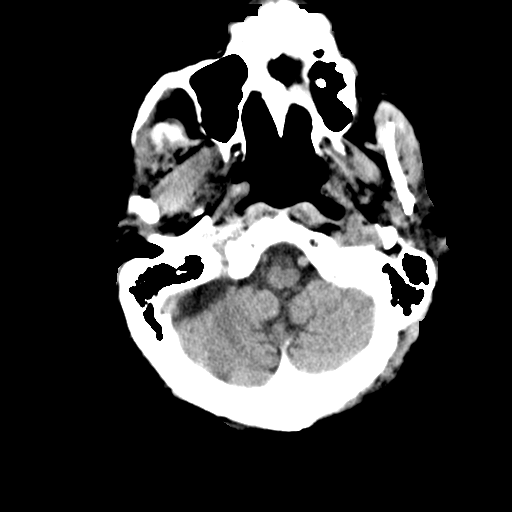
[im 10/33  brain]
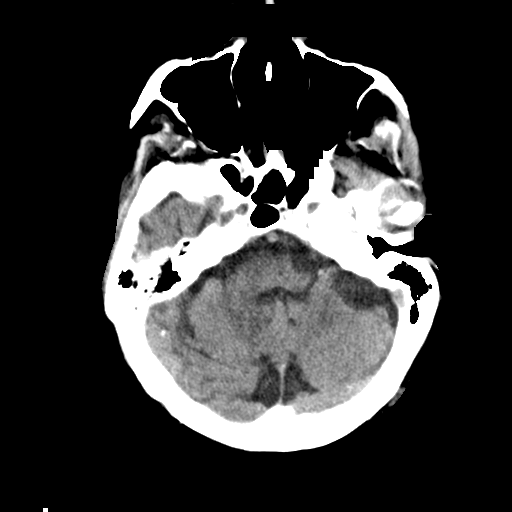
[im 15/33  brain]
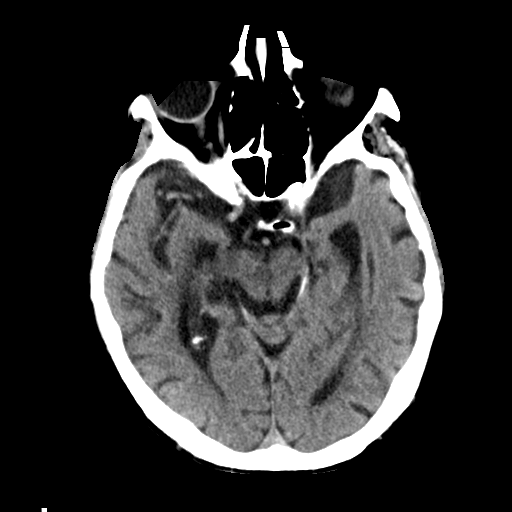
[im 18/33  brain]
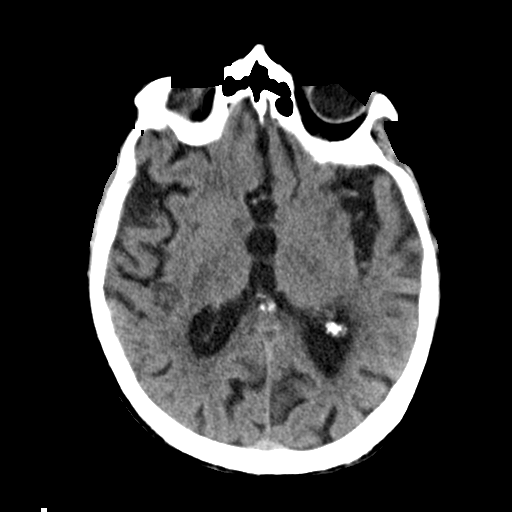
[im 18/33  bone]
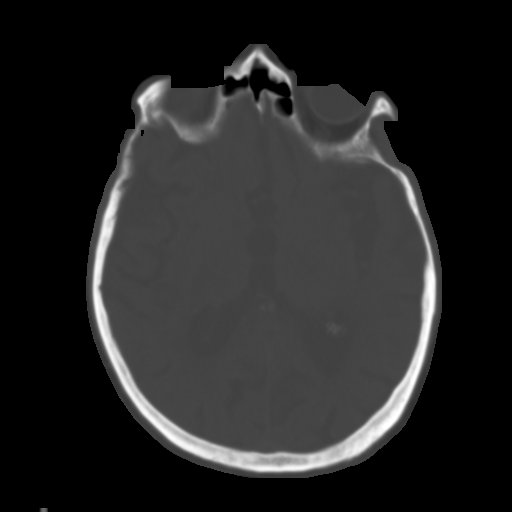
[im 23/33  brain]
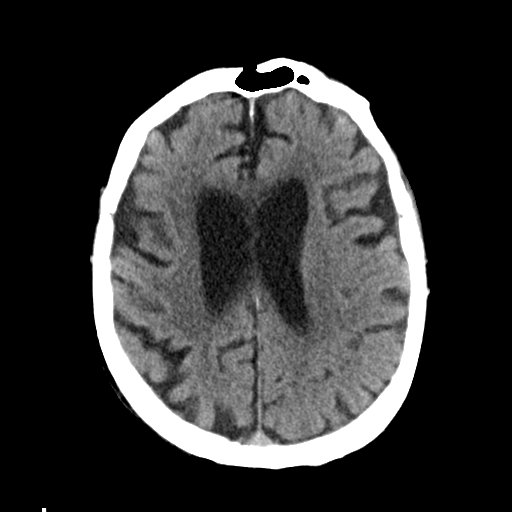
[im 26/33  brain]
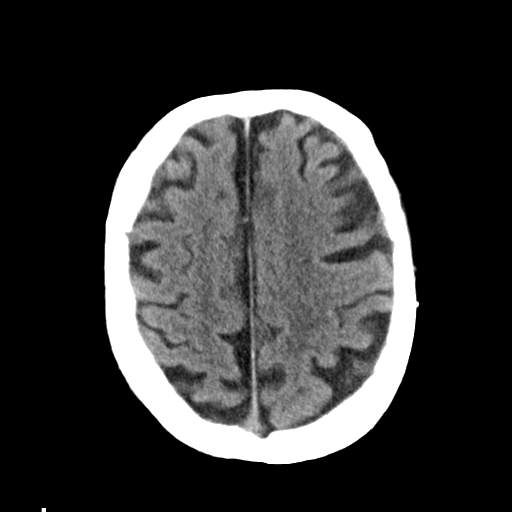
[im 30/33  brain]
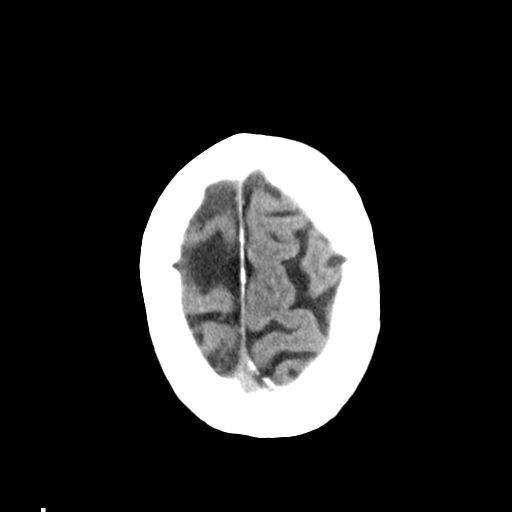

[Series 5: head 3.0 cor st · coronal · 0.36mm/px · 3 of 67 slices shown]
[im 26/67  brain]
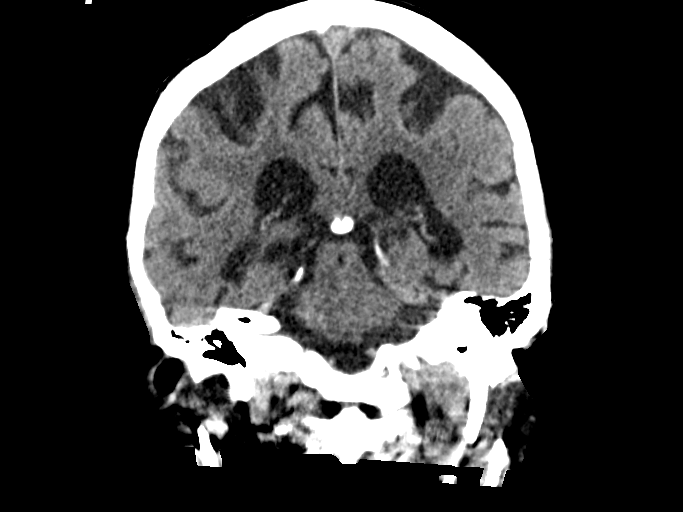
[im 31/67  brain]
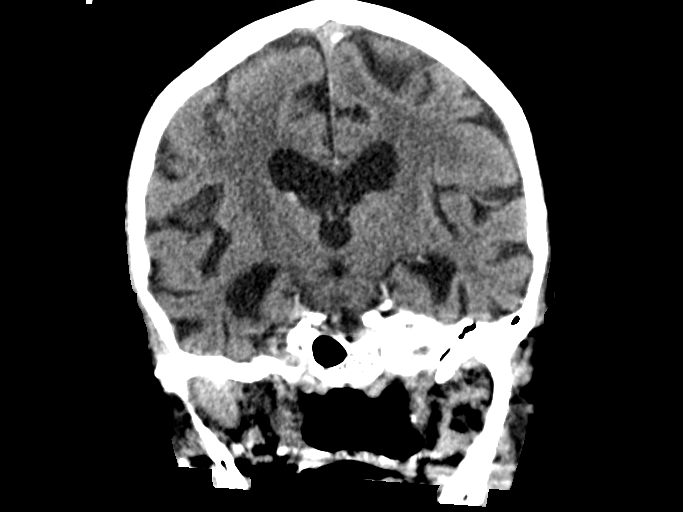
[im 36/67  brain]
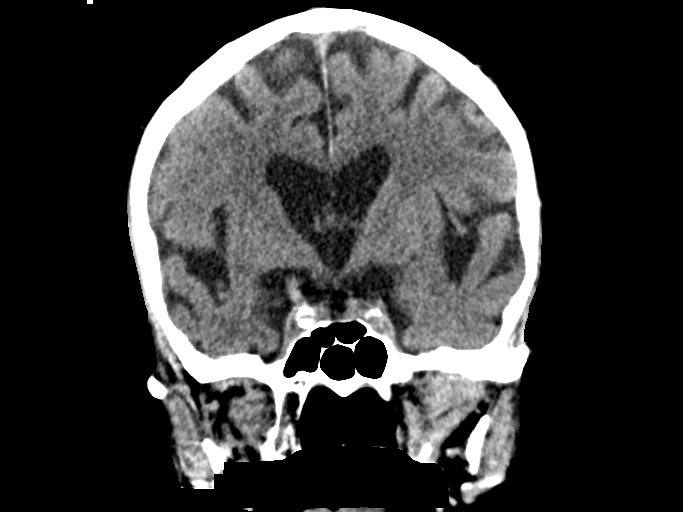

[Series 6: head 3.0 sag st · sagittal · 0.33mm/px · 3 of 61 slices shown]
[im 21/61  brain]
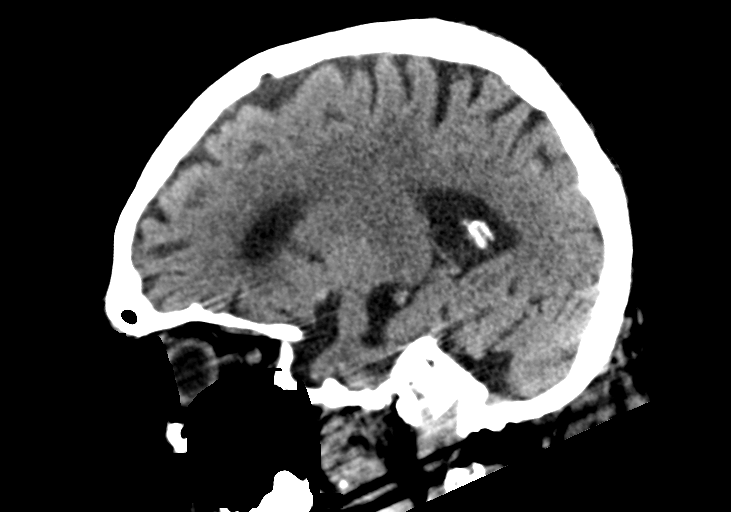
[im 31/61  brain]
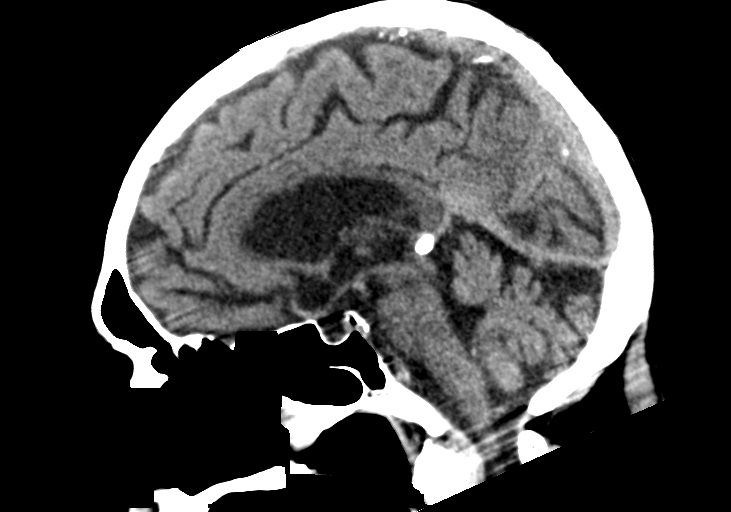
[im 41/61  brain]
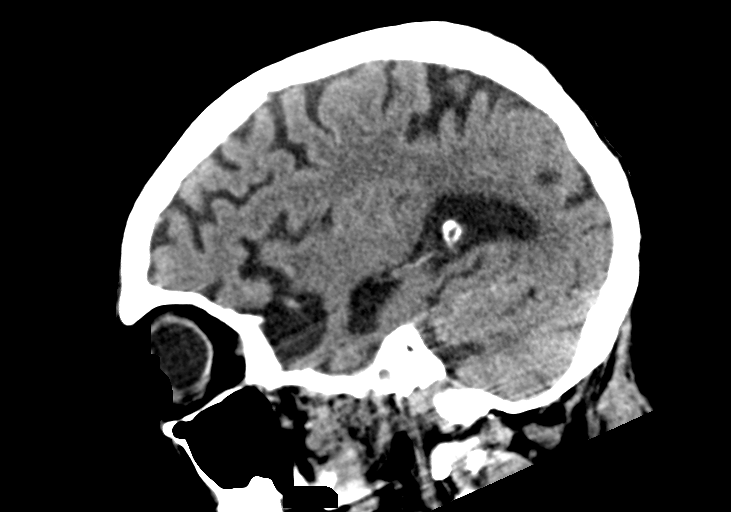

[14 of 47 positions shown; findings below may reference images not displayed]

FINDINGS: Brain: There is no acute intracranial hemorrhage or demarcated
cortical infarct. Chronic right basal ganglia lacunar infarct with
involvement of the right caudate nucleus. No evidence of
intracranial mass. No midline shift or extra-axial fluid collection.
Moderate generalized parenchymal atrophy. Ill-defined
hypoattenuation of the cerebral white matter is nonspecific, but
consistent with chronic small vessel ischemic disease.

Vascular: No definite hyperdense vessel. Atherosclerotic
calcification of the carotid artery siphons.

Skull: No calvarial fracture.

Sinuses/Orbits: Imaged orbits demonstrate no acute abnormality. Mild
scattered paranasal sinus mucosal thickening. No significant mastoid
effusion.

ASPECTS (Alberta Stroke Program Early CT Score)

- Ganglionic level infarction (caudate, lentiform nuclei, internal
capsule, insula, M1-M3 cortex): 7

- Supraganglionic infarction (M4-M6 cortex): 3

Total score (0-10 with 10 being normal): 10 (excluding chronic right
basal ganglia lacunar infarct.)

These results were communicated to Dr. Sydney at [DATE] pmon
01/04/2019by text page via the [employer] messaging system.
IMPRESSION: No acute intracranial hemorrhage or demarcated cortical infarct.

Generalized parenchymal atrophy with chronic small vessel ischemic
disease.

Chronic lacunar infarct within the right basal ganglia.
